# Patient Record
Sex: Female | Born: 1980 | Race: White | Marital: Married | Smoking: Never smoker
Health system: Northeastern US, Academic
[De-identification: ages and names within clinical notes are randomized; demographics above are authoritative.]

## PROBLEM LIST (undated history)

## (undated) DIAGNOSIS — O1490 Unspecified pre-eclampsia, unspecified trimester: Secondary | ICD-10-CM

## (undated) DIAGNOSIS — T7840XA Allergy, unspecified, initial encounter: Secondary | ICD-10-CM

## (undated) DIAGNOSIS — E049 Nontoxic goiter, unspecified: Secondary | ICD-10-CM

## (undated) HISTORY — PX: NO PAST SURGERIES: SHX2092

## (undated) HISTORY — DX: Allergy, unspecified, initial encounter: T78.40XA

## (undated) HISTORY — DX: Unspecified pre-eclampsia, unspecified trimester: O14.90

---

## 2014-02-11 LAB — HM MAMMOGRAPHY

## 2015-03-16 LAB — HM PAP SMEAR: HM PAP: NEGATIVE

## 2016-09-13 ENCOUNTER — Other Ambulatory Visit: Payer: Self-pay | Admitting: Obstetrics & Gynecology

## 2016-09-13 DIAGNOSIS — Z3045 Encounter for surveillance of transdermal patch hormonal contraceptive device: Secondary | ICD-10-CM

## 2016-09-13 NOTE — Telephone Encounter (Signed)
Pt calling - she's trying to refill her xulane which she's been on for years and was told there was a manufacturing thing and pharm doesn't think it will be getting any more. Pt doesn't know what to do nor what will take it's place.  Please adv.  [phone]

## 2016-09-13 NOTE — Telephone Encounter (Signed)
rx called into [facility] in [place]. Pharmacy states its not a problem, they have the [facility]

## 2017-04-26 ENCOUNTER — Other Ambulatory Visit: Payer: Self-pay

## 2017-04-26 MED ORDER — NORELGESTROMIN-ETH ESTRADIOL 150-35 MCG/24HR TD PTWK: 1.0000 | MEDICATED_PATCH | TRANSDERMAL | 0 refills | Status: DC

## 2017-04-26 NOTE — Telephone Encounter (Signed)
1 refill sent to pharm to last to pt appt end of this month

## 2017-05-10 ENCOUNTER — Encounter: Payer: Self-pay | Admitting: Obstetrics & Gynecology

## 2017-05-10 ENCOUNTER — Ambulatory Visit (INDEPENDENT_AMBULATORY_CARE_PROVIDER_SITE_OTHER): Payer: 59 | Admitting: Obstetrics & Gynecology

## 2017-05-10 VITALS — BP 130/90 | HR 76 | Ht 65.0 in | Wt 127.0 lb

## 2017-05-10 DIAGNOSIS — Z13220 Encounter for screening for lipoid disorders: Secondary | ICD-10-CM

## 2017-05-10 DIAGNOSIS — Z1321 Encounter for screening for nutritional disorder: Secondary | ICD-10-CM | POA: Diagnosis not present

## 2017-05-10 DIAGNOSIS — Z131 Encounter for screening for diabetes mellitus: Secondary | ICD-10-CM

## 2017-05-10 DIAGNOSIS — Z0000 Encounter for general adult medical examination without abnormal findings: Secondary | ICD-10-CM | POA: Insufficient documentation

## 2017-05-10 MED ORDER — NORELGESTROMIN-ETH ESTRADIOL 150-35 MCG/24HR TD PTWK: 1.0000 | MEDICATED_PATCH | TRANSDERMAL | 12 refills | Status: DC

## 2017-05-10 NOTE — Progress Notes (Signed)
HPI:      [name] is a 36 y.o. G1P1001 who LMP was Patient's last menstrual period was 04/21/2017., she presents today for her annual examination. The patient has no complaints today. The patient is sexually active. Her last pap: approximate date 2016 and was normal. The patient does perform self breast exams.  There is no notable family history of breast or ovarian cancer in her family.  The patient has regular exercise: yes.  The patient denies current symptoms of depression.    GYN History: Contraception: Ortho-Evra patches weekly  PMHx: History reviewed. No pertinent past medical history. History reviewed. No pertinent surgical history. History reviewed. No pertinent family history. Social History   Tobacco Use  . Smoking status: Never Smoker  . Smokeless tobacco: Never Used  Substance Use Topics  . Alcohol use: No    Frequency: Never  . Drug use: No    Current Outpatient Medications:  .  norelgestromin-ethinyl estradiol Burr Medico(XULANE) 150-35 MCG/24HR transdermal patch, Place 1 patch onto the skin once a week., Disp: 3 patch, Rfl: 12 Allergies: Penicillins  Review of Systems  Constitutional: Negative for chills, fever and malaise/fatigue.  HENT: Negative for congestion, sinus pain and sore throat.   Eyes: Negative for blurred vision and pain.  Respiratory: Negative for cough and wheezing.   Cardiovascular: Negative for chest pain and leg swelling.  Gastrointestinal: Negative for abdominal pain, constipation, diarrhea, heartburn, nausea and vomiting.  Genitourinary: Negative for dysuria, frequency, hematuria and urgency.  Musculoskeletal: Negative for back pain, joint pain, myalgias and neck pain.  Skin: Negative for itching and rash.  Neurological: Negative for dizziness, tremors and weakness.  Endo/Heme/Allergies: Does not bruise/bleed easily.  Psychiatric/Behavioral: Negative for depression. The patient is not nervous/anxious and does not have insomnia.      Objective: BP 130/90   Pulse 76   Ht 5\' 5"  (1.651 m)   Wt 127 lb (57.6 kg)   LMP 04/21/2017   BMI 21.13 kg/m   Filed Weights   05/10/17 1538  Weight: 127 lb (57.6 kg)   Body mass index is 21.13 kg/m. Physical Exam  Constitutional: She is oriented to person, place, and time. She appears well-developed and well-nourished. No distress.  Genitourinary: Rectum normal, vagina normal and uterus normal. Pelvic exam was performed with patient supine. There is no rash or lesion on the right labia. There is no rash or lesion on the left labia. Vagina exhibits no lesion. No bleeding in the vagina. Right adnexum does not display mass and does not display tenderness. Left adnexum does not display mass and does not display tenderness. Cervix does not exhibit motion tenderness, lesion, friability or polyp.   Uterus is mobile and midaxial. Uterus is not enlarged or exhibiting a mass.  HENT:  Head: Normocephalic and atraumatic. Head is without laceration.  Right Ear: Hearing normal.  Left Ear: Hearing normal.  Nose: No epistaxis.  No foreign bodies.  Mouth/Throat: Uvula is midline, oropharynx is clear and moist and mucous membranes are normal.  Eyes: Pupils are equal, round, and reactive to light.  Neck: Normal range of motion. Neck supple. No thyromegaly present.  Cardiovascular: Normal rate and regular rhythm. Exam reveals no gallop and no friction rub.  No murmur heard. Pulmonary/Chest: Effort normal and breath sounds normal. No respiratory distress. She has no wheezes. Right breast exhibits no mass, no skin change and no tenderness. Left breast exhibits no mass, no skin change and no tenderness.  Abdominal: Soft. Bowel sounds are normal.  She exhibits no distension. There is no tenderness. There is no rebound.  Musculoskeletal: Normal range of motion.  Neurological: She is alert and oriented to person, place, and time. No cranial nerve deficit.  Skin: Skin is warm and dry.  Psychiatric: She  has a normal mood and affect. Judgment normal.  Vitals reviewed.   Assessment:  ANNUAL EXAM 1. Annual physical exam   2. Screening for cholesterol level   3. Encounter for vitamin deficiency screening   4. Screening for diabetes mellitus      Screening Plan:            1.  Cervical Screening-  Pap smear schedule reviewed with patient  2. Breast screening- Exam annually and mammogram>40 planned   3. Colonoscopy every 10 years, Hemoccult testing - after age 38  4. Labs To return fasting at a later date  5. Counseling for contraception: Lucienne MinksOrtho-Evra    F/U  Return in about 1 year (around 05/10/2018) for Annual.  Annamarie MajorPaul Harris, MD, Merlinda FrederickFACOG [facility], [facility] 05/10/2017  4:11 PM

## 2017-05-10 NOTE — Patient Instructions (Addendum)
PAP every three years (2019) Labs due

## 2017-05-31 ENCOUNTER — Other Ambulatory Visit: Payer: 59

## 2017-06-12 ENCOUNTER — Other Ambulatory Visit: Payer: 59

## 2017-06-12 DIAGNOSIS — Z1321 Encounter for screening for nutritional disorder: Secondary | ICD-10-CM

## 2017-06-12 DIAGNOSIS — Z13220 Encounter for screening for lipoid disorders: Secondary | ICD-10-CM

## 2017-06-12 DIAGNOSIS — Z0000 Encounter for general adult medical examination without abnormal findings: Secondary | ICD-10-CM

## 2017-06-12 DIAGNOSIS — Z131 Encounter for screening for diabetes mellitus: Secondary | ICD-10-CM

## 2017-06-13 LAB — LIPID PANEL
Chol/HDL Ratio: 2.7 ratio (ref 0.0–4.4)
Cholesterol, Total: 226 mg/dL — ABNORMAL HIGH (ref 100–199)
HDL: 85 mg/dL (ref >39)
LDL Calculated: 121 mg/dL — ABNORMAL HIGH (ref 0–99)
TRIGLYCERIDES: 101 mg/dL (ref 0–149)
VLDL Cholesterol Cal: 20 mg/dL (ref 5–40)

## 2017-06-13 LAB — GLUCOSE, FASTING: Glucose, Plasma: 72 mg/dL (ref 65–99)

## 2017-06-13 LAB — VITAMIN D 25 HYDROXY (VIT D DEFICIENCY, FRACTURES): VIT D 25 HYDROXY: 23.6 ng/mL — AB (ref 30.0–100.0)

## 2017-06-13 LAB — TSH: TSH: 0.723 u[IU]/mL (ref 0.450–4.500)

## 2018-05-19 ENCOUNTER — Other Ambulatory Visit: Payer: Self-pay

## 2018-05-19 MED ORDER — NORELGESTROMIN-ETH ESTRADIOL 150-35 MCG/24HR TD PTWK: 1.0000 | MEDICATED_PATCH | TRANSDERMAL | 2 refills | Status: DC

## 2018-05-19 NOTE — Telephone Encounter (Signed)
Pt aware by vm refill eRx'd. 

## 2018-06-26 ENCOUNTER — Ambulatory Visit (INDEPENDENT_AMBULATORY_CARE_PROVIDER_SITE_OTHER): Payer: 59 | Admitting: Obstetrics & Gynecology

## 2018-06-26 ENCOUNTER — Encounter: Payer: Self-pay | Admitting: Obstetrics & Gynecology

## 2018-06-26 ENCOUNTER — Other Ambulatory Visit (HOSPITAL_COMMUNITY)
Admission: RE | Admit: 2018-06-26 | Discharge: 2018-06-26 | Disposition: A | Discharge status: Home / Self Care | Payer: Managed Care, Other (non HMO) | Source: Ambulatory Visit | Attending: Obstetrics & Gynecology | Admitting: Obstetrics & Gynecology

## 2018-06-26 VITALS — BP 120/80 | Ht 65.0 in | Wt 125.0 lb

## 2018-06-26 DIAGNOSIS — Z01419 Encounter for gynecological examination (general) (routine) without abnormal findings: Secondary | ICD-10-CM

## 2018-06-26 DIAGNOSIS — Z124 Encounter for screening for malignant neoplasm of cervix: Secondary | ICD-10-CM | POA: Insufficient documentation

## 2018-06-26 DIAGNOSIS — E559 Vitamin D deficiency, unspecified: Secondary | ICD-10-CM | POA: Diagnosis not present

## 2018-06-26 DIAGNOSIS — Z1239 Encounter for other screening for malignant neoplasm of breast: Secondary | ICD-10-CM | POA: Diagnosis not present

## 2018-06-26 DIAGNOSIS — E7800 Pure hypercholesterolemia, unspecified: Secondary | ICD-10-CM

## 2018-06-26 MED ORDER — NORELGESTROMIN-ETH ESTRADIOL 150-35 MCG/24HR TD PTWK: 1.0000 | MEDICATED_PATCH | TRANSDERMAL | 3 refills | Status: DC

## 2018-06-26 NOTE — Progress Notes (Signed)
HPI:      Kerri Graves is a 38 y.o. G1P1001 who LMP was Patient's last menstrual period was 06/16/2018., she presents today for her annual examination. The patient has no complaints today. The patient is sexually active. Her last pap: approximate date 2017 and was normal and last mammogram: approximate date 2015 and was normal. The patient does perform self breast exams.  There is no notable family history of breast or ovarian cancer in her family, although she is adopted and knows little of her FH.  The patient has regular exercise: yes.  The patient denies current symptoms of depression.    GYN History: Contraception: Ortho-Evra patches weekly  PMHx: History reviewed. No pertinent past medical history. History reviewed. No pertinent surgical history. History reviewed. No pertinent family history. Social History   Tobacco Use  . Smoking status: Never Smoker  . Smokeless tobacco: Never Used  Substance Use Topics  . Alcohol use: No    Frequency: Never  . Drug use: No    Current Outpatient Medications:  .  norelgestromin-ethinyl estradiol Burr Medico) 150-35 MCG/24HR transdermal patch, Place 1 patch onto the skin once a week. 3 weeks on, one week off, Disp: 9 patch, Rfl: 3 Allergies: Penicillins  Review of Systems  Constitutional: Negative for chills, fever and malaise/fatigue.  HENT: Negative for congestion, sinus pain and sore throat.   Eyes: Negative for blurred vision and pain.  Respiratory: Negative for cough and wheezing.   Cardiovascular: Negative for chest pain and leg swelling.  Gastrointestinal: Negative for abdominal pain, constipation, diarrhea, heartburn, nausea and vomiting.  Genitourinary: Negative for dysuria, frequency, hematuria and urgency.  Musculoskeletal: Negative for back pain, joint pain, myalgias and neck pain.  Skin: Negative for itching and rash.  Neurological: Negative for dizziness, tremors and weakness.  Endo/Heme/Allergies: Does not  bruise/bleed easily.  Psychiatric/Behavioral: Negative for depression. The patient is not nervous/anxious and does not have insomnia.     Objective: BP 120/80   Ht 5\' 5"  (1.651 m)   Wt 125 lb (56.7 kg)   LMP 06/16/2018   BMI 20.80 kg/m   Filed Weights   06/26/18 1530  Weight: 125 lb (56.7 kg)   Body mass index is 20.8 kg/m. Physical Exam Constitutional:      General: She is not in acute distress.    Appearance: She is well-developed.  Genitourinary:     Pelvic exam was performed with patient supine.     Vagina, uterus and rectum normal.     No lesions in the vagina.     No vaginal bleeding.     No cervical motion tenderness, friability, lesion or polyp.     Uterus is mobile.     Uterus is not enlarged.     No uterine mass detected.    Uterus is midaxial.     No right or left adnexal mass present.     Right adnexa not tender.     Left adnexa not tender.  HENT:     Head: Normocephalic and atraumatic. No laceration.     Right Ear: Hearing normal.     Left Ear: Hearing normal.     Mouth/Throat:     Pharynx: Uvula midline.  Eyes:     Pupils: Pupils are equal, round, and reactive to light.  Neck:     Musculoskeletal: Normal range of motion and neck supple.     Thyroid: No thyromegaly.  Cardiovascular:     Rate and Rhythm: Normal rate and regular  rhythm.     Heart sounds: No murmur. No friction rub. No gallop.   Pulmonary:     Effort: Pulmonary effort is normal. No respiratory distress.     Breath sounds: Normal breath sounds. No wheezing.  Chest:     Breasts:        Right: No mass, skin change or tenderness.        Left: No mass, skin change or tenderness.  Abdominal:     General: Bowel sounds are normal. There is no distension.     Palpations: Abdomen is soft.     Tenderness: There is no abdominal tenderness. There is no rebound.  Musculoskeletal: Normal range of motion.  Neurological:     Mental Status: She is alert and oriented to person, place, and time.      Cranial Nerves: No cranial nerve deficit.  Skin:    General: Skin is warm and dry.  Psychiatric:        Judgment: Judgment normal.  Vitals signs reviewed.   Assessment:  ANNUAL EXAM 1. Women's annual routine gynecological examination   2. Screening for cervical cancer   3. Screening for breast cancer   4. Vitamin D deficiency   5. Hypercholesteremia    Screening Plan:            1.  Cervical Screening-  Pap smear done today  2. Breast screening- Exam annually and mammogram>40 planned   3. Colonoscopy every 10 years, Hemoccult testing - after age 52  4. Labs To return fasting at a later date  5. Counseling for contraception: Ortho-Evra   6. Vitamin D deficiency - VITAMIN D 25 Hydroxy (Vit-D Deficiency, Fractures); Future  7. Hypercholesteremia - Lipid panel; Future      F/U  Return in about 1 year (around 06/27/2019) for Annual.  [name], MD, Merlinda Frederick Ob/Gyn, [facility] 06/26/2018  4:06 PM

## 2018-06-26 NOTE — Patient Instructions (Addendum)
PAP every three years Mammogram every year    Call [phone] to schedule at [facility] for follow up summer

## 2018-07-01 LAB — CYTOLOGY - PAP
Diagnosis: NEGATIVE
HPV (WINDOPATH): NOT DETECTED

## 2019-06-19 ENCOUNTER — Other Ambulatory Visit: Payer: Self-pay | Admitting: Obstetrics & Gynecology

## 2019-06-22 ENCOUNTER — Other Ambulatory Visit: Payer: Self-pay

## 2019-06-22 ENCOUNTER — Telehealth: Payer: Self-pay | Admitting: Obstetrics & Gynecology

## 2019-06-22 MED ORDER — XULANE 150-35 MCG/24HR TD PTWK: MEDICATED_PATCH | TRANSDERMAL | 0 refills | Status: DC

## 2019-06-22 NOTE — Telephone Encounter (Signed)
Called and left voice mail for patient to call back to be schedule °

## 2019-06-22 NOTE — Telephone Encounter (Signed)
Done

## 2019-06-22 NOTE — Telephone Encounter (Signed)
pts ean is sched please refill rx

## 2019-06-22 NOTE — Telephone Encounter (Signed)
Sch annual.  Refill done

## 2019-07-24 ENCOUNTER — Other Ambulatory Visit: Payer: Self-pay

## 2019-07-24 ENCOUNTER — Encounter: Payer: Self-pay | Admitting: Obstetrics & Gynecology

## 2019-07-24 ENCOUNTER — Ambulatory Visit (INDEPENDENT_AMBULATORY_CARE_PROVIDER_SITE_OTHER): Payer: No Typology Code available for payment source | Admitting: Obstetrics & Gynecology

## 2019-07-24 VITALS — BP 120/80 | Ht 65.0 in | Wt 132.0 lb

## 2019-07-24 DIAGNOSIS — Z01419 Encounter for gynecological examination (general) (routine) without abnormal findings: Secondary | ICD-10-CM

## 2019-07-24 DIAGNOSIS — Z1231 Encounter for screening mammogram for malignant neoplasm of breast: Secondary | ICD-10-CM

## 2019-07-24 DIAGNOSIS — E559 Vitamin D deficiency, unspecified: Secondary | ICD-10-CM

## 2019-07-24 DIAGNOSIS — E7800 Pure hypercholesterolemia, unspecified: Secondary | ICD-10-CM | POA: Diagnosis not present

## 2019-07-24 MED ORDER — XULANE 150-35 MCG/24HR TD PTWK: MEDICATED_PATCH | TRANSDERMAL | 3 refills | Status: DC

## 2019-07-24 NOTE — Progress Notes (Signed)
HPI:      [name] is a 39 y.o. G1P1001 who LMP was Patient's last menstrual period was 07/13/2019., she presents today for her annual examination. The patient has no complaints today. The patient is sexually active. Her last pap: approximate date 2020 and was normal and last mammogram: approximate date years ago and was normal. The patient does perform self breast exams.  There is no notable family history of breast or ovarian cancer in her family.  The patient has regular exercise: yes.  The patient denies current symptoms of depression.    GYN History: Contraception: Ortho-Evra patches weekly  PMHx: History reviewed. No pertinent past medical history. History reviewed. No pertinent surgical history. History reviewed. No pertinent family history. Social History   Tobacco Use  . Smoking status: Never Smoker  . Smokeless tobacco: Never Used  Substance Use Topics  . Alcohol use: No  . Drug use: No    Current Outpatient Medications:  .  norelgestromin-ethinyl estradiol (XULANE) 150-35 MCG/24HR transdermal patch, PLACE 1 PATCH ONTO THE SKIN ONCE A WEEK. 3 WEEKS ON, ONE WEEK OFF, AS DIRECTED, Disp: 9 patch, Rfl: 0 Allergies: Penicillins  Review of Systems  Constitutional: Negative for chills, fever and malaise/fatigue.  HENT: Negative for congestion, sinus pain and sore throat.   Eyes: Negative for blurred vision and pain.  Respiratory: Negative for cough and wheezing.   Cardiovascular: Negative for chest pain and leg swelling.  Gastrointestinal: Negative for abdominal pain, constipation, diarrhea, heartburn, nausea and vomiting.  Genitourinary: Negative for dysuria, frequency, hematuria and urgency.  Musculoskeletal: Negative for back pain, joint pain, myalgias and neck pain.  Skin: Negative for itching and rash.  Neurological: Negative for dizziness, tremors and weakness.  Endo/Heme/Allergies: Does not bruise/bleed easily.  Psychiatric/Behavioral: Negative for  depression. The patient is not nervous/anxious and does not have insomnia.     Objective: BP 120/80   Ht 5\' 5"  (1.651 m)   Wt 132 lb (59.9 kg)   LMP 07/13/2019   BMI 21.97 kg/m   Filed Weights   07/24/19 1527  Weight: 132 lb (59.9 kg)   Body mass index is 21.97 kg/m. Physical Exam Constitutional:      General: She is not in acute distress.    Appearance: She is well-developed.  Genitourinary:     Pelvic exam was performed with patient supine.     Vagina, uterus and rectum normal.     No lesions in the vagina.     No vaginal bleeding.     No cervical motion tenderness, friability, lesion or polyp.     Uterus is mobile.     Uterus is not enlarged.     No uterine mass detected.    Uterus is midaxial.     No right or left adnexal mass present.     Right adnexa not tender.     Left adnexa not tender.  HENT:     Head: Normocephalic and atraumatic. No laceration.     Right Ear: Hearing normal.     Left Ear: Hearing normal.     Mouth/Throat:     Pharynx: Uvula midline.  Eyes:     Pupils: Pupils are equal, round, and reactive to light.  Neck:     Thyroid: No thyromegaly.  Cardiovascular:     Rate and Rhythm: Normal rate and regular rhythm.     Heart sounds: No murmur. No friction rub. No gallop.   Pulmonary:     Effort: Pulmonary effort  is normal. No respiratory distress.     Breath sounds: Normal breath sounds. No wheezing.  Chest:     Breasts:        Right: No mass, skin change or tenderness.        Left: No mass, skin change or tenderness.  Abdominal:     General: Bowel sounds are normal. There is no distension.     Palpations: Abdomen is soft.     Tenderness: There is no abdominal tenderness. There is no rebound.  Musculoskeletal:        General: Normal range of motion.     Cervical back: Normal range of motion and neck supple.  Neurological:     Mental Status: She is alert and oriented to person, place, and time.     Cranial Nerves: No cranial nerve deficit.    Skin:    General: Skin is warm and dry.  Psychiatric:        Judgment: Judgment normal.  Vitals reviewed.     Assessment:  ANNUAL EXAM 1. Women's annual routine gynecological examination   2. Encounter for screening mammogram for malignant neoplasm of breast   3. Vitamin D deficiency   4. Hypercholesteremia      Screening Plan:            1.  Cervical Screening every 3 years  2. Breast screening- Exam annually and mammogram>40 planned   3. Labs To return fasting at a later date  Prior low Vit D and high Cholesterol  5. Counseling for contraception: Ortho-Evra patches monthly     F/U  Return in about 1 year (around 07/23/2020) for Annual.  [name], MD, Merlinda Frederick Ob/Gyn, [facility] 07/24/2019  4:17 PM

## 2019-07-24 NOTE — Patient Instructions (Signed)
PAP every three years Mammogram every year    Call [phone] to schedule at [facility] soon (fasting)

## 2019-10-16 ENCOUNTER — Other Ambulatory Visit: Payer: Self-pay | Admitting: Obstetrics & Gynecology

## 2019-10-20 ENCOUNTER — Telehealth: Payer: Self-pay

## 2019-10-20 NOTE — Telephone Encounter (Signed)
Called pt to remind her to schedule her mammo , had to leave voice message

## 2019-10-20 NOTE — Telephone Encounter (Signed)
-----   Message from Nadara Mustard, MD sent at 10/16/2019  3:21 PM EDT ----- Regarding: MMG Received notice she has not received MMG yet as ordered at her Annual. Please check and encourage her to do this, and document conversation.

## 2019-11-18 ENCOUNTER — Other Ambulatory Visit: Payer: Self-pay

## 2019-11-18 ENCOUNTER — Ambulatory Visit
Admission: RE | Admit: 2019-11-18 | Discharge: 2019-11-18 | Disposition: A | Discharge status: Home / Self Care | Payer: No Typology Code available for payment source | Source: Ambulatory Visit | Attending: Obstetrics & Gynecology | Admitting: Obstetrics & Gynecology

## 2019-11-18 DIAGNOSIS — Z1231 Encounter for screening mammogram for malignant neoplasm of breast: Secondary | ICD-10-CM | POA: Insufficient documentation

## 2020-06-06 ENCOUNTER — Ambulatory Visit
Admission: AD | Admit: 2020-06-06 | Discharge: 2020-06-06 | Disposition: A | Discharge status: Other Acute Inpatient Hospital | Payer: PRIVATE HEALTH INSURANCE | Source: Ambulatory Visit | Attending: Emergency Medicine | Admitting: Emergency Medicine

## 2020-06-06 ENCOUNTER — Emergency Department
Admission: EM | Admit: 2020-06-06 | Discharge: 2020-06-06 | Disposition: A | Discharge status: Home / Self Care | Payer: PRIVATE HEALTH INSURANCE | Source: Ambulatory Visit | Attending: Emergency Medicine | Admitting: Emergency Medicine

## 2020-06-06 ENCOUNTER — Emergency Department: Payer: PRIVATE HEALTH INSURANCE

## 2020-06-06 DIAGNOSIS — Z3202 Encounter for pregnancy test, result negative: Secondary | ICD-10-CM | POA: Insufficient documentation

## 2020-06-06 DIAGNOSIS — E042 Nontoxic multinodular goiter: Secondary | ICD-10-CM

## 2020-06-06 DIAGNOSIS — R221 Localized swelling, mass and lump, neck: Secondary | ICD-10-CM | POA: Insufficient documentation

## 2020-06-06 DIAGNOSIS — E041 Nontoxic single thyroid nodule: Secondary | ICD-10-CM | POA: Insufficient documentation

## 2020-06-06 LAB — CBC AND DIFFERENTIAL
Baso # K/uL: 0 10*3/uL (ref 0.0–0.1)
Basophil %: 0.3 %
Eos # K/uL: 0.1 10*3/uL (ref 0.0–0.4)
Eosinophil %: 0.8 %
Hematocrit: 39 % (ref 34–45)
Hemoglobin: 12.8 g/dL (ref 11.2–15.7)
IMM Granulocytes #: 0 10*3/uL (ref 0.0–0.0)
IMM Granulocytes: 0.3 %
Lymph # K/uL: 1.9 10*3/uL (ref 1.2–3.7)
Lymphocyte %: 18.7 %
MCH: 31 pg (ref 26–32)
MCHC: 33 g/dL (ref 32–36)
MCV: 95 fL (ref 79–95)
Mono # K/uL: 0.6 10*3/uL (ref 0.2–0.9)
Monocyte %: 5.6 %
Neut # K/uL: 7.7 10*3/uL — ABNORMAL HIGH (ref 1.6–6.1)
Nucl RBC # K/uL: 0 10*3/uL (ref 0.0–0.0)
Nucl RBC %: 0 /100 WBC (ref 0.0–0.2)
Platelets: 268 10*3/uL (ref 160–370)
RBC: 4.1 MIL/uL (ref 3.9–5.2)
RDW: 12.3 % (ref 11.7–14.4)
Seg Neut %: 74.3 %
WBC: 10.3 10*3/uL — ABNORMAL HIGH (ref 4.0–10.0)

## 2020-06-06 LAB — T4, FREE: Free T4: 1.2 ng/dL (ref 0.9–1.7)

## 2020-06-06 LAB — COMPREHENSIVE METABOLIC PANEL
ALT: 9 U/L (ref 0–35)
AST: 16 U/L (ref 0–35)
Albumin: 4 g/dL (ref 3.5–5.2)
Alk Phos: 38 U/L (ref 35–105)
Anion Gap: 11 (ref 7–16)
Bilirubin,Total: 0.4 mg/dL (ref 0.0–1.2)
CO2: 20 mmol/L (ref 20–28)
Calcium: 8.6 mg/dL — ABNORMAL LOW (ref 8.8–10.2)
Chloride: 100 mmol/L (ref 96–108)
Creatinine: 0.87 mg/dL (ref 0.51–0.95)
GFR,Black: 95 *
GFR,Caucasian: 82 *
Glucose: 90 mg/dL (ref 60–99)
Lab: 16 mg/dL (ref 6–20)
Potassium: 4.1 mmol/L (ref 3.4–4.7)
Sodium: 131 mmol/L — ABNORMAL LOW (ref 133–145)
Total Protein: 6.7 g/dL (ref 6.3–7.7)

## 2020-06-06 LAB — T3, FREE: T3,Free: 3.1 pg/mL (ref 2.0–4.4)

## 2020-06-06 LAB — TSH: TSH: 0.61 u[IU]/mL (ref 0.27–4.20)

## 2020-06-06 LAB — PREGNANCY TEST, SERUM: Preg,Serum: NEGATIVE

## 2020-06-06 LAB — HM HIV SCREENING OFFERED

## 2020-06-06 MED ORDER — IOHEXOL 350 MG/ML (OMNIPAQUE) IV SOLN 500ML BOTTLE *I*
1.0000 mL | Freq: Once | INTRAVENOUS | Status: AC
Start: 2020-06-06 — End: 2020-06-06
  Administered 2020-06-06: 100 mL via INTRAVENOUS

## 2020-06-06 NOTE — ED Triage Notes (Signed)
Patient here with lump on neck  Started yesterday and not painful    Patient is from out of town and would like evaluated

## 2020-06-06 NOTE — Discharge Instructions (Signed)
Go to [facility] emergency department for evaluation of neck mass

## 2020-06-06 NOTE — ED Provider Notes (Signed)
History     Chief Complaint   Patient presents with    Mass     neck     39 yr old F presents with concern regarding fullness/mass to left anterior neck- pt states she noted this incidentally while itching her neck yesterday. She states there was some concern about her thyroid function during her last pregnancy approx 16 years ago (as well as preeclampsia) but no issues since. She denies any recent illness. Denies any pain.        History provided by:  Patient      Medical/Surgical/Family History     Past Medical History:   Diagnosis Date    Preeclampsia         There is no problem list on file for this patient.           Past Surgical History:   Procedure Laterality Date    TONSILLECTOMY       No family history on file.       Social History     Tobacco Use    Smoking status: Never Smoker    Smokeless tobacco: Never Used   Substance Use Topics    Alcohol use: Yes    Drug use: Never     Living Situation     Questions Responses    Patient lives with Spouse    Homeless     Caregiver for other family member     External Services     Employment Employed    Domestic Violence Risk No                Review of Systems   Review of Systems   Constitutional: Negative for activity change, appetite change, diaphoresis, fatigue, fever and unexpected weight change.   HENT: Negative for congestion and rhinorrhea.    Eyes: Negative for visual disturbance.   Respiratory: Negative for chest tightness and shortness of breath.    Cardiovascular: Negative for chest pain.   Gastrointestinal: Negative for abdominal pain, nausea and vomiting.   Genitourinary: Negative.    Musculoskeletal: Negative for back pain and neck pain.   Skin: Negative for rash and wound.   Neurological: Negative for dizziness, speech difficulty, weakness, numbness and headaches.   Psychiatric/Behavioral: Negative for confusion. The patient is not hyperactive.    All other systems reviewed and are negative.      Physical Exam     Triage Vitals      First  Recorded BP: 137/89, Resp: 18 Oxygen Therapy SpO2: 98 %, Oximetry Source: Rt Hand, O2 Device: None (Room air), Heart Rate: 97, (06/06/20 0901) Heart Rate (via Pulse Ox): 97, (06/06/20 0901).      Physical Exam  Vitals and nursing note reviewed.   Constitutional:       Appearance: Normal appearance.   HENT:      Head: Normocephalic and atraumatic.      Right Ear: External ear normal.      Left Ear: External ear normal.      Nose: Nose normal.      Mouth/Throat:      Mouth: Mucous membranes are moist.      Pharynx: No oropharyngeal exudate or posterior oropharyngeal erythema.   Eyes:      Extraocular Movements: Extraocular movements intact.      Pupils: Pupils are equal, round, and reactive to light.   Neck:      Thyroid: Thyroid mass present.      Trachea: Trachea and phonation normal.  Comments: Non mobile painless 1+ cm mass to left mid anterior neck in region of left lobe of thyroid  Cardiovascular:      Rate and Rhythm: Normal rate and regular rhythm.   Pulmonary:      Effort: Pulmonary effort is normal.      Breath sounds: Normal breath sounds.   Abdominal:      General: Abdomen is flat.      Tenderness: There is no abdominal tenderness. There is no guarding or rebound.   Musculoskeletal:         General: Normal range of motion.      Cervical back: Full passive range of motion without pain, normal range of motion and neck supple. No pain with movement.   Lymphadenopathy:      Cervical: No cervical adenopathy.      Right cervical: No superficial, deep or posterior cervical adenopathy.     Left cervical: No superficial, deep or posterior cervical adenopathy.   Skin:     General: Skin is warm.   Neurological:      General: No focal deficit present.      Mental Status: She is alert and oriented to person, place, and time.   Psychiatric:         Mood and Affect: Mood normal.         Medical Decision Making   Patient seen by me on:  06/06/2020    Assessment:  39 yr old F presents with painless incidentally  discovered mass to left anterior neck    Differential diagnosis:  Thyroid nodule, isolated lymphadenopathy     Plan:  Labs, CT soft tissue neck    Independent review of: Existing labs, CT scans, chart/prior records    ED Course and Disposition:  Labs Reviewed  CBC AND DIFFERENTIAL - Abnormal; Notable for the following components:     WBC                           10.3 (*)               Neut # K/uL                   7.7 (*)             All other components within normal limits  COMPREHENSIVE METABOLIC PANEL - Abnormal; Notable for the following components:     Sodium                        131 (*)                Calcium                       8.6 (*)             All other components within normal limits  TSH  T4, FREE  PREGNANCY TEST, SERUM  T3, FREE    CT neck soft tissue with IV contrast   Final Result        The marked area of clinical concern corresponds to a low-attenuation nodule within the LEFT thyroid lobe measuring 2 cm. Additional subcentimeter low attenuation bilateral thyroid nodules are noted. If clinically warranted, consider dedicated thyroid     ultrasound for further evaluation, on a nonemergent basis.            END OF IMPRESSION  [facility] submits this DICOM format image data and final report to the [facility], an independent secure electronic health information exchange, on a reciprocally searchable basis (with patient authorization) for a minimum of 12 months after exam     date.       Results discussed- nothing further needed from an emergent standpoint- pt will follow up back in [place], recommend eval with ENT re: need for any further evaluation. Return here as needed              Fabio Asa, DO          FentonAntionette Poles, DO  06/06/20 1351

## 2020-06-06 NOTE — Discharge Instructions (Signed)
When you return home to [place] make arrangements through your PMD for ENT referral so you can be evaluated further for any additional workup that may be necessary

## 2020-06-06 NOTE — ED Notes (Signed)
Patient verbalizes understanding of discharge instructions. All questions/concerns addressed. IV removed, appropriate clothing in place, VSS, AOx4. Patient ambulated out on own, stable while ambulatory. Has an appropriate ride home. Given access to MyChart so patient can review admission results and future appointments.

## 2020-06-06 NOTE — ED Notes (Addendum)
Patient coming in after noticing a small lump on the left side of her neck. Patient states she is from out of town and was at [employer], when she went to itch her neck, and noticed the small bump. Patient denies any pain at the site and is tolerating secretions. Airway intact at this time.

## 2020-06-06 NOTE — ED Triage Notes (Signed)
Pt states she felt a lump/mass in the left side of neck yesterday. Pt is from out of town and didn't want to wait till Friday to get checked out. Pt denies pain in her neck.            Prehospital medications given: No

## 2020-06-06 NOTE — UC Provider Note (Signed)
History     Chief Complaint   Patient presents with    Neck Pain     Pt comes to uc for a swelling to her neck - left anterior neck - noted yesterday- no injury - not painful - no problem with swallowing or breathing          Medical/Surgical/Family History     Past Medical History:   Diagnosis Date    Preeclampsia         There is no problem list on file for this patient.           Past Surgical History:   Procedure Laterality Date    TONSILLECTOMY       No family history on file.       Social History     Tobacco Use    Smoking status: Never Smoker    Smokeless tobacco: Never Used   Substance Use Topics    Alcohol use: Yes    Drug use: Never     Living Situation     Questions Responses    Patient lives with Spouse    Homeless     Caregiver for other family member     External Services     Employment Employed    Domestic Violence Risk No                Review of Systems   Review of Systems   Constitutional: Negative for chills and fever.   HENT: Negative for trouble swallowing.    Respiratory: Negative for cough and shortness of breath.    Musculoskeletal: Negative for neck pain.        Area of swelling thyroid area anterior left       Physical Exam   Triage Vitals  Triage Start: Start, (06/06/20 5400)   First Recorded BP: 128/84, Resp: 14, Temp: 35.9 C (96.7 F), Temp src: TEMPORAL Oxygen Therapy SpO2: 100 %, Oximetry Source: Lt Hand, O2 Device: None (Room air), Heart Rate: 88, (06/06/20 0819)  .  First Pain Reported  0-10 Scale: 0, (06/06/20 8676)       Physical Exam  Vitals and nursing note reviewed.   Constitutional:       General: She is not in acute distress.     Appearance: She is not ill-appearing.   Skin:     General: Skin is warm and dry.      Findings: No erythema or rash.      Comments: Approximately 2 cm area of swelling left side of neck anteriorly - thyroid area - no signs of infection -- soft feel to swelling -    Neurological:      Mental Status: She is alert.          Medical Decision  Making        Medical Decision Making  Assessment:    Pt comes to uc for swelling to left side of neck anteriorly - thyroid area    Differential diagnosis:    Neck soft tissue swelling - thyroid nodule -     Plan and Results:    Referred pt to [facility] emergency for possible imaging        Diagnosis and Disposition:    Neck mass - referred to [facility] emergency - private car - dept notified    Return precautions discussed and provided on AVS.    Discharge Medications  Current Discharge Medication List      Follow-up  @AVSFOLLOWUP @  Patient Instructions   .  Go to [facility] emergency department for evaluation of neck mass      Final Diagnosis  Final diagnoses:   [R22.1] Neck mass (Primary)         Perley Jain, MD              Shelanda Duvall, Jewel Baize, MD  06/06/20 1243

## 2020-06-20 ENCOUNTER — Telehealth: Payer: Self-pay

## 2020-06-20 NOTE — Telephone Encounter (Signed)
Patient was seen in ED in [place] over Christmas and had a test done for a nodule on her Thyroid. That's what the CT scan that he did showed. They advised her she would need a referral for ENT. She is requesting [facility] send referral for ENT. [phone]

## 2020-06-21 ENCOUNTER — Other Ambulatory Visit: Payer: Self-pay | Admitting: Obstetrics & Gynecology

## 2020-06-21 DIAGNOSIS — E041 Nontoxic single thyroid nodule: Secondary | ICD-10-CM

## 2020-06-23 ENCOUNTER — Other Ambulatory Visit: Payer: Self-pay | Admitting: Obstetrics & Gynecology

## 2020-06-28 ENCOUNTER — Telehealth: Payer: Self-pay

## 2020-06-28 MED ORDER — XULANE 150-35 MCG/24HR TD PTWK: MEDICATED_PATCH | TRANSDERMAL | 0 refills | Status: DC

## 2020-06-28 NOTE — Telephone Encounter (Signed)
Patient needs refill of Xulane to get to scheduled apt 07/28/2020. Cb#[phone]

## 2020-07-11 ENCOUNTER — Other Ambulatory Visit (HOSPITAL_COMMUNITY): Payer: Self-pay | Admitting: Otolaryngology

## 2020-07-11 ENCOUNTER — Other Ambulatory Visit: Payer: Self-pay | Admitting: Otolaryngology

## 2020-07-11 DIAGNOSIS — E041 Nontoxic single thyroid nodule: Secondary | ICD-10-CM

## 2020-07-19 ENCOUNTER — Ambulatory Visit
Admission: RE | Admit: 2020-07-19 | Discharge: 2020-07-19 | Disposition: A | Discharge status: Home / Self Care | Payer: BC Managed Care – PPO | Source: Ambulatory Visit | Attending: Otolaryngology | Admitting: Otolaryngology

## 2020-07-19 ENCOUNTER — Other Ambulatory Visit: Payer: Self-pay

## 2020-07-19 DIAGNOSIS — E041 Nontoxic single thyroid nodule: Secondary | ICD-10-CM | POA: Diagnosis present

## 2020-07-28 ENCOUNTER — Ambulatory Visit (INDEPENDENT_AMBULATORY_CARE_PROVIDER_SITE_OTHER): Payer: BC Managed Care – PPO | Admitting: Obstetrics & Gynecology

## 2020-07-28 ENCOUNTER — Encounter: Payer: Self-pay | Admitting: Obstetrics & Gynecology

## 2020-07-28 ENCOUNTER — Other Ambulatory Visit: Payer: Self-pay

## 2020-07-28 VITALS — BP 120/80 | Ht 65.0 in | Wt 137.0 lb

## 2020-07-28 DIAGNOSIS — Z131 Encounter for screening for diabetes mellitus: Secondary | ICD-10-CM

## 2020-07-28 DIAGNOSIS — Z1231 Encounter for screening mammogram for malignant neoplasm of breast: Secondary | ICD-10-CM | POA: Diagnosis not present

## 2020-07-28 DIAGNOSIS — Z01419 Encounter for gynecological examination (general) (routine) without abnormal findings: Secondary | ICD-10-CM | POA: Diagnosis not present

## 2020-07-28 DIAGNOSIS — Z1321 Encounter for screening for nutritional disorder: Secondary | ICD-10-CM

## 2020-07-28 DIAGNOSIS — Z13220 Encounter for screening for lipoid disorders: Secondary | ICD-10-CM | POA: Diagnosis not present

## 2020-07-28 MED ORDER — XULANE 150-35 MCG/24HR TD PTWK: MEDICATED_PATCH | TRANSDERMAL | 4 refills | Status: DC

## 2020-07-28 NOTE — Progress Notes (Signed)
HPI:      [name] is a 40 y.o. G1P1001 who LMP was Patient's last menstrual period was 07/14/2020., she presents today for her annual examination. The patient has no complaints today. The patient is sexually active. Her last pap: approximate date 2020 and was normal and last mammogram: approximate date 2021 and was normal. The patient does perform self breast exams.  There is no notable family history of breast or ovarian cancer in her family.  The patient has regular exercise: yes.  The patient denies current symptoms of depression.    GYN History: Contraception: Ortho-Evra patches weekly  PMHx: History reviewed. No pertinent past medical history. History reviewed. No pertinent surgical history. Family History  Problem Relation Age of Onset  . Breast cancer Neg Hx    Social History   Tobacco Use  . Smoking status: Never Smoker  . Smokeless tobacco: Never Used  Vaping Use  . Vaping Use: Never used  Substance Use Topics  . Alcohol use: No  . Drug use: No    Current Outpatient Medications:  .  norelgestromin-ethinyl estradiol (XULANE) 150-35 MCG/24HR transdermal patch, PLACE 1 PATCH ONTO THE SKIN ONCE A WEEK. 3 WEEKS ON, ONE WEEK OFF, AS DIRECTED, Disp: 3 patch, Rfl: 0 Allergies: Penicillins  Review of Systems  Constitutional: Negative for chills, fever and malaise/fatigue.  HENT: Negative for congestion, sinus pain and sore throat.   Eyes: Negative for blurred vision and pain.  Respiratory: Negative for cough and wheezing.   Cardiovascular: Negative for chest pain and leg swelling.  Gastrointestinal: Negative for abdominal pain, constipation, diarrhea, heartburn, nausea and vomiting.  Genitourinary: Negative for dysuria, frequency, hematuria and urgency.  Musculoskeletal: Negative for back pain, joint pain, myalgias and neck pain.  Skin: Negative for itching and rash.  Neurological: Negative for dizziness, tremors and weakness.  Endo/Heme/Allergies: Does not  bruise/bleed easily.  Psychiatric/Behavioral: Negative for depression. The patient is not nervous/anxious and does not have insomnia.     Objective: BP 120/80   Ht 5\' 5"  (1.651 m)   Wt 137 lb (62.1 kg)   LMP 07/14/2020   BMI 22.80 kg/m   Filed Weights   07/28/20 1532  Weight: 137 lb (62.1 kg)   Body mass index is 22.8 kg/m. Physical Exam Constitutional:      General: She is not in acute distress.    Appearance: She is well-developed and well-nourished.  Genitourinary:     Vagina, uterus and rectum normal.     There is no rash or lesion on the right labia.     There is no rash or lesion on the left labia.    No lesions in the vagina.     No vaginal bleeding.      Right Adnexa: not tender and no mass present.    Left Adnexa: not tender and no mass present.    No cervical motion tenderness, friability, lesion or polyp.     Uterus is mobile.     Uterus is not enlarged.     No uterine mass detected.    Uterus is midaxial.     Pelvic exam was performed with patient supine.  Breasts:     Right: No mass, skin change or tenderness.     Left: No mass, skin change or tenderness.    HENT:     Head: Normocephalic and atraumatic. No laceration.     Right Ear: Hearing normal.     Left Ear: Hearing normal.  Nose: No epistaxis or foreign body.     Mouth/Throat:     Mouth: Oropharynx is clear and moist and mucous membranes are normal.     Pharynx: Uvula midline.  Eyes:     Pupils: Pupils are equal, round, and reactive to light.  Neck:     Thyroid: Thyroid mass present. No thyromegaly.      Comments: Mobile left sided thyroid nodule Cardiovascular:     Rate and Rhythm: Normal rate and regular rhythm.     Heart sounds: No murmur heard. No friction rub. No gallop.   Pulmonary:     Effort: Pulmonary effort is normal. No respiratory distress.     Breath sounds: Normal breath sounds. No wheezing.  Abdominal:     General: Bowel sounds are normal. There is no distension.      Palpations: Abdomen is soft.     Tenderness: There is no abdominal tenderness. There is no rebound.  Musculoskeletal:        General: Normal range of motion.     Cervical back: Normal range of motion and neck supple.  Neurological:     Mental Status: She is alert and oriented to person, place, and time.     Cranial Nerves: No cranial nerve deficit.  Skin:    General: Skin is warm and dry.  Psychiatric:        Mood and Affect: Mood and affect normal.        Judgment: Judgment normal.  Vitals reviewed.     Assessment:  ANNUAL EXAM 1. Women's annual routine gynecological examination   2. Encounter for screening mammogram for malignant neoplasm of breast   3. Screening for cholesterol level   4. Screening for diabetes mellitus   5. Encounter for vitamin deficiency screening      Screening Plan:            1.  Cervical Screening-  Pap smear not done today  2. Breast screening- Exam annually and mammogram>40 planned   3. Labs To return fasting at a later date  4. Counseling for contraception: Lucienne Minks     F/U  Return in about 1 year (around 07/28/2021) for Annual.  [name], MD, [facility], [facility] 07/28/2020  4:11 PM

## 2020-07-28 NOTE — Patient Instructions (Addendum)
PAP every three years Labs this summer, fasting Mammogram every year - due in July    Call [phone] to schedule at [facility]  Thank you for choosing [facility]. As part of our ongoing efforts to improve patient experience, we would appreciate your feedback. Please fill out the short survey that you will receive by mail or MyChart. Your opinion is important to [place]! - Dr. Tiburcio Pea

## 2020-08-01 ENCOUNTER — Other Ambulatory Visit: Payer: Self-pay | Admitting: Otolaryngology

## 2020-08-01 DIAGNOSIS — E041 Nontoxic single thyroid nodule: Secondary | ICD-10-CM

## 2020-08-21 ENCOUNTER — Other Ambulatory Visit: Payer: Self-pay | Admitting: Obstetrics & Gynecology

## 2020-10-20 ENCOUNTER — Other Ambulatory Visit: Payer: Self-pay | Admitting: Obstetrics & Gynecology

## 2020-11-23 ENCOUNTER — Other Ambulatory Visit: Payer: BC Managed Care – PPO

## 2020-11-24 ENCOUNTER — Other Ambulatory Visit: Payer: Self-pay

## 2020-11-24 ENCOUNTER — Other Ambulatory Visit: Payer: BC Managed Care – PPO

## 2020-11-24 DIAGNOSIS — Z1321 Encounter for screening for nutritional disorder: Secondary | ICD-10-CM

## 2020-11-24 DIAGNOSIS — Z13220 Encounter for screening for lipoid disorders: Secondary | ICD-10-CM

## 2020-11-24 DIAGNOSIS — Z131 Encounter for screening for diabetes mellitus: Secondary | ICD-10-CM

## 2020-11-25 LAB — VITAMIN D 25 HYDROXY (VIT D DEFICIENCY, FRACTURES): Vit D, 25-Hydroxy: 42.5 ng/mL (ref 30.0–100.0)

## 2020-11-25 LAB — LIPID PANEL
Chol/HDL Ratio: 2.6 ratio (ref 0.0–4.4)
Cholesterol, Total: 183 mg/dL (ref 100–199)
HDL: 70 mg/dL (ref >39)
LDL Chol Calc (NIH): 94 mg/dL (ref 0–99)
Triglycerides: 109 mg/dL (ref 0–149)
VLDL Cholesterol Cal: 19 mg/dL (ref 5–40)

## 2020-11-25 LAB — GLUCOSE, FASTING: Glucose, Plasma: 78 mg/dL (ref 65–99)

## 2021-02-08 ENCOUNTER — Ambulatory Visit
Admission: RE | Admit: 2021-02-08 | Discharge: 2021-02-08 | Disposition: A | Discharge status: Home / Self Care | Payer: BC Managed Care – PPO | Source: Ambulatory Visit | Attending: Obstetrics & Gynecology | Admitting: Obstetrics & Gynecology

## 2021-02-08 ENCOUNTER — Other Ambulatory Visit: Payer: Self-pay

## 2021-02-08 DIAGNOSIS — Z1231 Encounter for screening mammogram for malignant neoplasm of breast: Secondary | ICD-10-CM | POA: Diagnosis present

## 2021-02-14 ENCOUNTER — Other Ambulatory Visit: Payer: Self-pay | Admitting: Obstetrics & Gynecology

## 2021-02-14 DIAGNOSIS — R928 Other abnormal and inconclusive findings on diagnostic imaging of breast: Secondary | ICD-10-CM

## 2021-02-14 DIAGNOSIS — N6489 Other specified disorders of breast: Secondary | ICD-10-CM

## 2021-02-15 ENCOUNTER — Other Ambulatory Visit: Payer: Self-pay | Admitting: Obstetrics and Gynecology

## 2021-02-15 DIAGNOSIS — N6489 Other specified disorders of breast: Secondary | ICD-10-CM

## 2021-02-15 DIAGNOSIS — R928 Other abnormal and inconclusive findings on diagnostic imaging of breast: Secondary | ICD-10-CM

## 2021-02-24 ENCOUNTER — Other Ambulatory Visit: Payer: Self-pay

## 2021-02-24 ENCOUNTER — Ambulatory Visit
Admission: RE | Admit: 2021-02-24 | Discharge: 2021-02-24 | Disposition: A | Discharge status: Home / Self Care | Payer: BC Managed Care – PPO | Source: Ambulatory Visit | Attending: Obstetrics and Gynecology | Admitting: Obstetrics and Gynecology

## 2021-02-24 DIAGNOSIS — N6489 Other specified disorders of breast: Secondary | ICD-10-CM | POA: Diagnosis present

## 2021-02-24 DIAGNOSIS — R928 Other abnormal and inconclusive findings on diagnostic imaging of breast: Secondary | ICD-10-CM

## 2021-07-19 ENCOUNTER — Ambulatory Visit
Admission: RE | Admit: 2021-07-19 | Discharge: 2021-07-19 | Disposition: A | Discharge status: Home / Self Care | Payer: BC Managed Care – PPO | Source: Ambulatory Visit | Attending: Otolaryngology | Admitting: Otolaryngology

## 2021-07-19 ENCOUNTER — Other Ambulatory Visit: Payer: Self-pay

## 2021-07-19 DIAGNOSIS — E041 Nontoxic single thyroid nodule: Secondary | ICD-10-CM

## 2021-08-25 ENCOUNTER — Other Ambulatory Visit: Payer: Self-pay

## 2021-08-25 ENCOUNTER — Ambulatory Visit
Admission: RE | Admit: 2021-08-25 | Discharge: 2021-08-25 | Disposition: A | Discharge status: Home / Self Care | Payer: BC Managed Care – PPO | Source: Ambulatory Visit | Attending: Obstetrics and Gynecology | Admitting: Obstetrics and Gynecology

## 2021-08-25 DIAGNOSIS — N6489 Other specified disorders of breast: Secondary | ICD-10-CM | POA: Diagnosis present

## 2021-08-25 DIAGNOSIS — R928 Other abnormal and inconclusive findings on diagnostic imaging of breast: Secondary | ICD-10-CM | POA: Insufficient documentation

## 2021-08-27 ENCOUNTER — Encounter: Payer: Self-pay | Admitting: Obstetrics and Gynecology

## 2021-10-18 ENCOUNTER — Other Ambulatory Visit: Payer: Self-pay

## 2021-10-18 MED ORDER — XULANE 150-35 MCG/24HR TD PTWK: MEDICATED_PATCH | TRANSDERMAL | 0 refills | Status: DC

## 2021-10-18 NOTE — Telephone Encounter (Signed)
Pt needs RF on BC. Annual has been scheduled. RF sent, pt aware. ?

## 2021-11-01 ENCOUNTER — Ambulatory Visit
Admission: RE | Admit: 2021-11-01 | Discharge: 2021-11-01 | Disposition: A | Discharge status: Home / Self Care | Payer: BC Managed Care – PPO | Source: Ambulatory Visit | Attending: Emergency Medicine | Admitting: Emergency Medicine

## 2021-11-01 VITALS — BP 123/89 | HR 87 | Temp 98.2°F | Resp 18

## 2021-11-01 DIAGNOSIS — H00014 Hordeolum externum left upper eyelid: Secondary | ICD-10-CM | POA: Diagnosis not present

## 2021-11-01 HISTORY — DX: Nontoxic goiter, unspecified: E04.9

## 2021-11-01 MED ORDER — ERYTHROMYCIN 5 MG/GM OP OINT: TOPICAL_OINTMENT | OPHTHALMIC | 0 refills | Status: DC

## 2021-11-01 NOTE — ED Triage Notes (Signed)
Patient c/o LFT upper eye lid swelling x 1 day.   Patient denies visual changes.   Patient endorses redness upon waking this morning that has improved.   Patient states " I was outside yesterday and a bug did fly near my eye".   Patient endorses eye drainage.   Patient denies using any medications.

## 2021-11-01 NOTE — ED Provider Notes (Signed)
HPI  SUBJECTIVE:  Kerri Graves is a 41 y.o. female who presents with left upper eyelid tenderness, erythema, swelling starting last night.  No conjunctival injection, eye drainage, visual changes, photophobia, allergy symptoms.  She denies rubbing her eyes.  No trauma or foreign body sensation.  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  No aggravating or alleviating factors.  She has not tried anything for this.  Past medical history of thyroid nodule.   Past Medical History:  Diagnosis Date   Goiter     History reviewed. No pertinent surgical history.  Family History  Problem Relation Age of Onset   Breast cancer Neg Hx     Social History   Tobacco Use   Smoking status: Never   Smokeless tobacco: Never  Vaping Use   Vaping Use: Never used  Substance Use Topics   Alcohol use: No   Drug use: No    No current facility-administered medications for this encounter.  Current Outpatient Medications:    erythromycin ophthalmic ointment, 1 cm ribbon to affected eyelid qid x 10 days, Disp: 5 g, Rfl: 0   norelgestromin-ethinyl estradiol (XULANE) 150-35 MCG/24HR transdermal patch, UNWRAP AND APPLY 1 PATCH TO SKIN ONCE A WEEK FOR 3 WEEKS ON AND 1 WEEK OFF AS DIRECTED, Disp: 9 patch, Rfl: 0  Allergies  Allergen Reactions   Penicillins     " Since childhood, I don't know what happens".      ROS  As noted in HPI.   Physical Exam  BP 123/89   Pulse 87   Temp 98.2 F (36.8 C)   Resp 18   LMP 10/18/2021 (Approximate)   SpO2 99%   Constitutional: Well developed, well nourished, no acute distress Eyes:  EOMI, conjunctiva normal bilaterally, PERRLA.  No direct or consensual photophobia.  Positive erythema, very mild central tenderness upper left eyelid.  No foreign body seen on lid eversion.  Positive point on the lid margin where the stye will present.  No pointing.  No expressible purulent  drainage.         HENT: Normocephalic, atraumatic,mucus membranes moist Respiratory: Normal inspiratory effort Cardiovascular: Normal rate GI: nondistended skin: No rash, skin intact Musculoskeletal: no deformities Neurologic: Alert & oriented x 3, no focal neuro deficits Psychiatric: Speech and behavior appropriate   ED Course   Medications - No data to display  No orders of the defined types were placed in this encounter.   No results found for this or any previous visit (from the past 24 hour(s)). No results found.  ED Clinical Impression  1. Hordeolum externum of left upper eyelid      ED Assessment/Plan  Patient with an incoming stye.  Lid hygiene, erythromycin ointment, warm compresses.  Follow-up with PCP Or ophthalmology as needed.   Discussed MDM, treatment plan, and plan for follow-up with patient. patient agrees with plan.   Meds ordered this encounter  Medications   erythromycin ophthalmic ointment    Sig: 1 cm ribbon to affected eyelid qid x 10 days    Dispense:  5 g    Refill:  0      *This clinic note was created using Scientist, clinical (histocompatibility and immunogenetics). Therefore, there may be occasional mistakes despite careful proofreading.  ?    Domenick Gong, MD 11/01/21 1106

## 2021-11-01 NOTE — Discharge Instructions (Addendum)
Start doing warm compresses 4 times a day. You may get some baby shampoo, dilute this with warm water, and gently wipe your upper and lower eyelid while you are taking a shower. This will help prevent the recurrence of any styes or chalazions.  Follow-up with your doctor as needed.  Follow up with ophthalmologist if this this becomes severe.  Try not to rub your eyes. You may use Systane as often as you want for comfort. Return immediately to the ER for fever above 100.4, if you have pain moving your eyes, any visual changes, nausea, vomiting, headaches, a rash around your eye, or any other concerns.  Go to www.goodrx.com  or www.costplusdrugs.com to look up your medications. This will give you a list of where you can find your prescriptions at the most affordable prices. Or ask the pharmacist what the cash price is, or if they have any other discount programs available to help make your medication more affordable. This can be less expensive than what you would pay with insurance.

## 2022-01-16 ENCOUNTER — Ambulatory Visit: Payer: BC Managed Care – PPO | Admitting: Obstetrics and Gynecology

## 2022-01-19 ENCOUNTER — Other Ambulatory Visit: Payer: Self-pay | Admitting: Obstetrics and Gynecology

## 2022-03-16 ENCOUNTER — Other Ambulatory Visit: Payer: Self-pay | Admitting: Obstetrics and Gynecology

## 2022-03-16 DIAGNOSIS — Z1231 Encounter for screening mammogram for malignant neoplasm of breast: Secondary | ICD-10-CM

## 2022-03-16 DIAGNOSIS — N6489 Other specified disorders of breast: Secondary | ICD-10-CM

## 2022-03-19 DIAGNOSIS — E041 Nontoxic single thyroid nodule: Secondary | ICD-10-CM | POA: Insufficient documentation

## 2022-03-19 NOTE — Progress Notes (Unsigned)
   PCP:  Pcp, No   No chief complaint on file.    HPI:      Kerri Graves is a 41 y.o. G1P1001 whose LMP was No LMP recorded., presents today for her annual examination.  Her menses are {norm/abn:715}, lasting {number: 22536} days.  Dysmenorrhea {dysmen:716}. She {does:18564} have intermenstrual bleeding.  Sex activity: single partner, contraception - Ortho-Evra patches weekly.  Last Pap: 06/26/18 Results were: no abnormalities /neg HPV DNA  Hx of STDs: {STD hx:14358}  Last mammogram: 01/21/21  Results were: cat 3 RT breast; addl imaging 08/25/21 was normal; bilat dx mammo due 8/23 There is no FH of breast cancer. There is no FH of ovarian cancer. The patient {does:18564} do self-breast exams.  Tobacco use: {tob:20664} Alcohol use: {Alcohol:11675} No drug use.  Exercise: {exercise:31265}  She {does:18564} get adequate calcium and Vitamin D in her diet. Hx of LT thyroid cyst, not requiring bx on 2/23 u/s.   Patient Active Problem List   Diagnosis Date Noted   Annual physical exam 05/10/2017    No past surgical history on file.  Family History  Problem Relation Age of Onset   Breast cancer Neg Hx     Social History   Socioeconomic History   Marital status: Married    Spouse name: Not on file   Number of children: Not on file   Years of education: Not on file   Highest education level: Not on file  Occupational History   Not on file  Tobacco Use   Smoking status: Never   Smokeless tobacco: Never  Vaping Use   Vaping Use: Never used  Substance and Sexual Activity   Alcohol use: No   Drug use: No   Sexual activity: Yes    Birth control/protection: Patch  Other Topics Concern   Not on file  Social History Narrative   Not on file   Social Determinants of Health   Financial Resource Strain: Not on file  Food Insecurity: Not on file  Transportation Needs: Not on file  Physical Activity: Not on file  Stress: Not on file  Social Connections: Not on  file  Intimate Partner Violence: Not on file     Current Outpatient Medications:    erythromycin ophthalmic ointment, 1 cm ribbon to affected eyelid qid x 10 days, Disp: 5 g, Rfl: 0   ZAFEMY 150-35 MCG/24HR transdermal patch, UNWRAP AND APPLY 1 PATCH TO THE SKIN ONCE A WEEK FOR 3 WEEKS ON AND 1 WEEK OFF AS DIRECTED, Disp: 9 patch, Rfl: 0     ROS:  Review of Systems BREAST: No symptoms   Objective: There were no vitals taken for this visit.   OBGyn Exam  Results: No results found for this or any previous visit (from the past 24 hour(s)).  Assessment/Plan: No diagnosis found.  No orders of the defined types were placed in this encounter.            GYN counsel {counseling: 16159}     F/U  No follow-ups on file.  Song Myre B. Edilia Ghuman, PA-C 03/19/2022 1:27 PM

## 2022-03-20 ENCOUNTER — Ambulatory Visit (INDEPENDENT_AMBULATORY_CARE_PROVIDER_SITE_OTHER): Payer: BC Managed Care – PPO | Admitting: Obstetrics and Gynecology

## 2022-03-20 ENCOUNTER — Encounter: Payer: Self-pay | Admitting: Obstetrics and Gynecology

## 2022-03-20 VITALS — BP 100/70 | Ht 65.0 in | Wt 133.0 lb

## 2022-03-20 DIAGNOSIS — Z3045 Encounter for surveillance of transdermal patch hormonal contraceptive device: Secondary | ICD-10-CM | POA: Diagnosis not present

## 2022-03-20 DIAGNOSIS — E041 Nontoxic single thyroid nodule: Secondary | ICD-10-CM

## 2022-03-20 DIAGNOSIS — Z01411 Encounter for gynecological examination (general) (routine) with abnormal findings: Secondary | ICD-10-CM

## 2022-03-20 DIAGNOSIS — Z01419 Encounter for gynecological examination (general) (routine) without abnormal findings: Secondary | ICD-10-CM

## 2022-03-20 DIAGNOSIS — Z1231 Encounter for screening mammogram for malignant neoplasm of breast: Secondary | ICD-10-CM

## 2022-03-20 DIAGNOSIS — R928 Other abnormal and inconclusive findings on diagnostic imaging of breast: Secondary | ICD-10-CM

## 2022-03-20 MED ORDER — ZAFEMY 150-35 MCG/24HR TD PTWK: MEDICATED_PATCH | TRANSDERMAL | 3 refills | Status: DC

## 2022-03-20 NOTE — Patient Instructions (Signed)
I value your feedback and you entrusting us with your care. If you get a [facility] patient survey, I would appreciate you taking the time to let us know about your experience today. Thank you!  [facility]: [phone]      

## 2022-04-10 ENCOUNTER — Ambulatory Visit
Admission: RE | Admit: 2022-04-10 | Discharge: 2022-04-10 | Disposition: A | Discharge status: Home / Self Care | Payer: BC Managed Care – PPO | Source: Ambulatory Visit | Attending: Obstetrics and Gynecology | Admitting: Obstetrics and Gynecology

## 2022-04-10 ENCOUNTER — Encounter: Payer: Self-pay | Admitting: Obstetrics and Gynecology

## 2022-04-10 DIAGNOSIS — N6489 Other specified disorders of breast: Secondary | ICD-10-CM | POA: Insufficient documentation

## 2022-04-10 DIAGNOSIS — R928 Other abnormal and inconclusive findings on diagnostic imaging of breast: Secondary | ICD-10-CM | POA: Insufficient documentation

## 2022-04-10 DIAGNOSIS — Z1231 Encounter for screening mammogram for malignant neoplasm of breast: Secondary | ICD-10-CM | POA: Insufficient documentation

## 2022-04-19 ENCOUNTER — Other Ambulatory Visit: Payer: Self-pay | Admitting: Obstetrics and Gynecology

## 2022-04-19 DIAGNOSIS — Z3045 Encounter for surveillance of transdermal patch hormonal contraceptive device: Secondary | ICD-10-CM

## 2023-04-04 ENCOUNTER — Other Ambulatory Visit: Payer: Self-pay | Admitting: Obstetrics and Gynecology

## 2023-04-04 ENCOUNTER — Other Ambulatory Visit: Payer: Self-pay

## 2023-04-04 DIAGNOSIS — Z3045 Encounter for surveillance of transdermal patch hormonal contraceptive device: Secondary | ICD-10-CM

## 2023-04-04 MED ORDER — NORELGESTROMIN-ETH ESTRADIOL 150-35 MCG/24HR TD PTWK: MEDICATED_PATCH | TRANSDERMAL | 0 refills | Status: DC

## 2023-04-04 NOTE — Telephone Encounter (Signed)
Pt caling for refill of her bc to get her to her appt in Jan.  [phone]  Left detailed msg refill eRx'd.

## 2023-06-16 NOTE — Progress Notes (Signed)
 PCP:  Pcp, No   Chief Complaint  Patient presents with   Gynecologic Exam    No concerns     HPI:      [name] is a 43 y.o. G1P1001 whose LMP was Patient's last menstrual period was 05/26/2023 (approximate)., presents today for her annual examination.  Her menses are regular every 28-30 days, lasting 3-4 days, getting lighter.  Dysmenorrhea none. She does not have intermenstrual bleeding. No vasomotor sx.   Sex activity: single partner, contraception - Ortho-Evra patches weekly. No pain/bleeding.  Last Pap: 06/26/18 Results were: no abnormalities /neg HPV DNA . No hx of abn paps.   Last mammogram: 04/10/22 Results were: cat 3 RT breast; bilat dx mammo due 10/24 for 2 yr stability  Pt has limited FH due to adoption. There is no FH of breast cancer. There is no FH of ovarian cancer. The patient does do self-breast exams.  Tobacco use: The patient denies current or previous tobacco use. Alcohol use: social drinker No drug use.  Exercise: moderately active  She does get adequate calcium and Vitamin D  in her diet. Hx of LT thyroid cyst, not requiring bx for f/u on 2/23 u/s. Getting smaller.  Patient Active Problem List   Diagnosis Date Noted   Thyroid cyst 03/19/2022   Annual physical exam 05/10/2017    Past Surgical History:  Procedure Laterality Date   NO PAST SURGERIES      Family History  Problem Relation Age of Onset   Breast cancer Neg Hx     Social History   Socioeconomic History   Marital status: Married    Spouse name: Not on file   Number of children: Not on file   Years of education: Not on file   Highest education level: Not on file  Occupational History   Not on file  Tobacco Use   Smoking status: Never   Smokeless tobacco: Never  Vaping Use   Vaping status: Never Used  Substance and Sexual Activity   Alcohol use: Yes    Comment: occ   Drug use: No   Sexual activity: Yes    Birth control/protection: Patch  Other Topics Concern    Not on file  Social History Narrative   Not on file   Social Drivers of Health   Financial Resource Strain: Not on file  Food Insecurity: Not on file  Transportation Needs: Not on file  Physical Activity: Not on file  Stress: Not on file  Social Connections: Not on file  Intimate Partner Violence: Unknown (10/07/2021)   [facility]   Intimate Partner Violence    Fear of Current or Ex-Partner: Not on file     Current Outpatient Medications:    norelgestromin -ethinyl estradiol  (ZAFEMY ) 150-35 MCG/24HR transdermal patch, Place 1 patch onto the skin once a week., Disp: 9 patch, Rfl: 0     ROS:  Review of Systems  Constitutional:  Negative for fatigue, fever and unexpected weight change.  Respiratory:  Negative for cough, shortness of breath and wheezing.   Cardiovascular:  Negative for chest pain, palpitations and leg swelling.  Gastrointestinal:  Negative for blood in stool, constipation, diarrhea, nausea and vomiting.  Endocrine: Negative for cold intolerance, heat intolerance and polyuria.  Genitourinary:  Negative for dyspareunia, dysuria, flank pain, frequency, genital sores, hematuria, menstrual problem, pelvic pain, urgency, vaginal bleeding, vaginal discharge and vaginal pain.  Musculoskeletal:  Negative for back pain, joint swelling and myalgias.  Skin:  Negative for rash.  Neurological:  Negative for dizziness, syncope, light-headedness, numbness and headaches.  Hematological:  Negative for adenopathy.  Psychiatric/Behavioral:  Negative for agitation, confusion, sleep disturbance and suicidal ideas. The patient is not nervous/anxious.    BREAST: No symptoms   Objective: BP (!) 157/93   Pulse 87   Ht 5' 5 (1.651 m)   Wt 151 lb (68.5 kg)   LMP 05/26/2023 (Approximate)   BMI 25.13 kg/m  2nd BP is 157/93  Physical Exam Constitutional:      Appearance: She is well-developed.  Genitourinary:     Vulva normal.     Right Labia: No  rash, tenderness or lesions.    Left Labia: No tenderness, lesions or rash.    No vaginal discharge, erythema or tenderness.      Right Adnexa: not tender and no mass present.    Left Adnexa: not tender and no mass present.    No cervical friability or polyp.     Uterus is not enlarged or tender.  Breasts:    Right: No mass, nipple discharge, skin change or tenderness.     Left: No mass, nipple discharge, skin change or tenderness.  HENT:     Head:   Neck:     Thyroid: Thyroid mass present. No thyromegaly.  Cardiovascular:     Rate and Rhythm: Normal rate and regular rhythm.     Heart sounds: Normal heart sounds. No murmur heard. Pulmonary:     Effort: Pulmonary effort is normal.     Breath sounds: Normal breath sounds.  Abdominal:     Palpations: Abdomen is soft.     Tenderness: There is no abdominal tenderness. There is no guarding or rebound.  Musculoskeletal:        General: Normal range of motion.     Cervical back: Normal range of motion.  Lymphadenopathy:     Cervical: No cervical adenopathy.  Neurological:     General: No focal deficit present.     Mental Status: She is alert and oriented to person, place, and time.     Cranial Nerves: No cranial nerve deficit.  Skin:    General: Skin is warm and dry.  Psychiatric:        Mood and Affect: Mood normal.        Behavior: Behavior normal.        Thought Content: Thought content normal.        Judgment: Judgment normal.  Vitals reviewed.     Assessment/Plan: Encounter for annual routine gynecological examination  Cervical cancer screening - Plan: Cytology - PAP  Screening for HPV (human papillomavirus) - Plan: Cytology - PAP  Encounter for surveillance of transdermal patch hormonal contraceptive device - Plan: norelgestromin -ethinyl estradiol  (ZAFEMY ) 150-35 MCG/24HR transdermal patch; Rx RF. Will re-eval after f/u BP check.   Encounter for screening mammogram for malignant neoplasm of breast - Plan: MM 3D  DIAGNOSTIC MAMMOGRAM BILATERAL BREAST; pt to schedule mammo  Abnormal mammogram of right breast - Plan: MM 3D DIAGNOSTIC MAMMOGRAM BILATERAL BREAST  Thyroid cyst--getting smaller, no f/u needed per last u/s.   Elevated blood pressure reading--RTO in 1 wk for BP recheck. Drank coffee before appt, no known FH HTN    Meds ordered this encounter  Medications   norelgestromin -ethinyl estradiol  (ZAFEMY ) 150-35 MCG/24HR transdermal patch    Sig: Place 1 patch onto the skin once a week.    Dispense:  9 patch    Refill:  0    Supervising Provider:  CHERRY, ANIKA [[id number]]             GYN counsel breast self exam, mammography screening, adequate intake of calcium and vitamin D , diet and exercise     F/U  Return in about 1 week (around 06/24/2023) for BP recheck with me.  Zuriel Yeaman B. Zanylah Hardie, PA-C 06/17/2023 4:32 PM

## 2023-06-17 ENCOUNTER — Encounter: Payer: Self-pay | Admitting: Obstetrics and Gynecology

## 2023-06-17 ENCOUNTER — Other Ambulatory Visit: Payer: Self-pay | Admitting: Obstetrics and Gynecology

## 2023-06-17 ENCOUNTER — Ambulatory Visit: Payer: BC Managed Care – PPO | Admitting: Obstetrics and Gynecology

## 2023-06-17 ENCOUNTER — Other Ambulatory Visit (HOSPITAL_COMMUNITY)
Admission: RE | Admit: 2023-06-17 | Discharge: 2023-06-17 | Disposition: A | Discharge status: Home / Self Care | Payer: 59 | Source: Ambulatory Visit | Attending: Obstetrics and Gynecology | Admitting: Obstetrics and Gynecology

## 2023-06-17 VITALS — BP 157/93 | HR 87 | Ht 65.0 in | Wt 151.0 lb

## 2023-06-17 DIAGNOSIS — Z3045 Encounter for surveillance of transdermal patch hormonal contraceptive device: Secondary | ICD-10-CM

## 2023-06-17 DIAGNOSIS — Z124 Encounter for screening for malignant neoplasm of cervix: Secondary | ICD-10-CM | POA: Insufficient documentation

## 2023-06-17 DIAGNOSIS — Z1151 Encounter for screening for human papillomavirus (HPV): Secondary | ICD-10-CM

## 2023-06-17 DIAGNOSIS — N6489 Other specified disorders of breast: Secondary | ICD-10-CM

## 2023-06-17 DIAGNOSIS — Z1231 Encounter for screening mammogram for malignant neoplasm of breast: Secondary | ICD-10-CM

## 2023-06-17 DIAGNOSIS — R928 Other abnormal and inconclusive findings on diagnostic imaging of breast: Secondary | ICD-10-CM

## 2023-06-17 DIAGNOSIS — Z01419 Encounter for gynecological examination (general) (routine) without abnormal findings: Secondary | ICD-10-CM

## 2023-06-17 DIAGNOSIS — R030 Elevated blood-pressure reading, without diagnosis of hypertension: Secondary | ICD-10-CM

## 2023-06-17 DIAGNOSIS — E041 Nontoxic single thyroid nodule: Secondary | ICD-10-CM

## 2023-06-17 MED ORDER — NORELGESTROMIN-ETH ESTRADIOL 150-35 MCG/24HR TD PTWK: 1.0000 | MEDICATED_PATCH | TRANSDERMAL | 0 refills | Status: DC

## 2023-06-17 NOTE — Patient Instructions (Signed)
 I value your feedback and you entrusting [place] with your care. If you get a [facility] patient survey, I would appreciate you taking the time to let us know about your experience today. Thank you!  [facility] ([place])--[phone]

## 2023-06-19 LAB — CYTOLOGY - PAP
Comment: NEGATIVE
Diagnosis: NEGATIVE
High risk HPV: NEGATIVE

## 2023-06-24 NOTE — Progress Notes (Signed)
 Pcp, No   Chief Complaint  Patient presents with   Blood Pressure Check    HPI:      [name] is a 43 y.o. G1P1001 whose LMP was Patient's last menstrual period was 05/26/2023 (approximate)., presents today for 1 wk BP f/u. Had elevated BP at 06/17/23 annual which is unusual for pt. Not exercising as much, increased sodium in food eating school cafeteria lunches. No prior hx of HTN. On xulane .  06/17/23 BP (!) 157/93; 2nd BP is 157/93   Patient Active Problem List   Diagnosis Date Noted   Thyroid cyst 03/19/2022   Annual physical exam 05/10/2017    Past Surgical History:  Procedure Laterality Date   NO PAST SURGERIES      Family History  Problem Relation Age of Onset   Breast cancer Neg Hx     Social History   Socioeconomic History   Marital status: Married    Spouse name: Not on file   Number of children: Not on file   Years of education: Not on file   Highest education level: Not on file  Occupational History   Not on file  Tobacco Use   Smoking status: Never   Smokeless tobacco: Never  Vaping Use   Vaping status: Never Used  Substance and Sexual Activity   Alcohol use: Yes    Comment: occ   Drug use: No   Sexual activity: Yes    Birth control/protection: Patch  Other Topics Concern   Not on file  Social History Narrative   Not on file   Social Drivers of Health   Financial Resource Strain: Not on file  Food Insecurity: Not on file  Transportation Needs: Not on file  Physical Activity: Not on file  Stress: Not on file  Social Connections: Not on file  Intimate Partner Violence: Unknown (10/07/2021)   [facility]   Intimate Partner Violence    Fear of Current or Ex-Partner: Not on file    Outpatient Medications Prior to Visit  Medication Sig Dispense Refill   norelgestromin -ethinyl estradiol  (ZAFEMY ) 150-35 MCG/24HR transdermal patch Place 1 patch onto the skin once a week. 9 patch 0   No  facility-administered medications prior to visit.      ROS:  Review of Systems  Constitutional:  Negative for fever.  Gastrointestinal:  Negative for blood in stool, constipation, diarrhea, nausea and vomiting.  Genitourinary:  Negative for dyspareunia, dysuria, flank pain, frequency, hematuria, urgency, vaginal bleeding, vaginal discharge and vaginal pain.  Musculoskeletal:  Negative for back pain.  Skin:  Negative for rash.   BREAST: No symptoms   OBJECTIVE:   Vitals:  BP 138/88   Pulse 71   Ht 5' 5 (1.651 m)   Wt 151 lb (68.5 kg)   LMP 05/26/2023 (Approximate)   BMI 25.13 kg/m   Physical Exam Constitutional:      Appearance: Normal appearance.  Pulmonary:     Effort: Pulmonary effort is normal.  Musculoskeletal:        General: Normal range of motion.  Neurological:     Mental Status: She is alert and oriented to person, place, and time.  Psychiatric:        Judgment: Judgment normal.     Assessment/Plan: Blood pressure check--improved today. Keep BP check at home. F/u if >140/90. Increase exercise, DASH diet. Cont xulane  for now. F/u prn.     Return if symptoms worsen or fail to improve.  Prather Failla B. Roddrick Sharron, PA-C 06/25/2023 4:36 PM

## 2023-06-25 ENCOUNTER — Encounter: Payer: Self-pay | Admitting: Obstetrics and Gynecology

## 2023-06-25 ENCOUNTER — Ambulatory Visit: Payer: 59 | Admitting: Obstetrics and Gynecology

## 2023-06-25 VITALS — BP 138/88 | HR 71 | Ht 65.0 in | Wt 151.0 lb

## 2023-06-25 DIAGNOSIS — Z0130 Encounter for examination of blood pressure without abnormal findings: Secondary | ICD-10-CM

## 2023-06-25 DIAGNOSIS — Z3045 Encounter for surveillance of transdermal patch hormonal contraceptive device: Secondary | ICD-10-CM

## 2023-06-25 MED ORDER — NORELGESTROMIN-ETH ESTRADIOL 150-35 MCG/24HR TD PTWK: 1.0000 | MEDICATED_PATCH | TRANSDERMAL | 2 refills | Status: DC

## 2023-07-01 ENCOUNTER — Ambulatory Visit
Admission: RE | Admit: 2023-07-01 | Discharge: 2023-07-01 | Disposition: A | Discharge status: Home / Self Care | Payer: 59 | Source: Ambulatory Visit | Attending: Obstetrics and Gynecology | Admitting: Obstetrics and Gynecology

## 2023-07-01 DIAGNOSIS — N6489 Other specified disorders of breast: Secondary | ICD-10-CM | POA: Diagnosis present

## 2023-07-02 ENCOUNTER — Encounter: Payer: Self-pay | Admitting: Obstetrics and Gynecology

## 2023-10-24 IMAGING — MG MM DIGITAL DIAGNOSTIC UNILAT*R* W/ TOMO W/ CAD
4 series · 4 of 12 positions shown · non-contrast
Comparison: Prior films

CLINICAL DATA: Short-term follow-up right breast asymmetry

EXAM:
DIGITAL DIAGNOSTIC UNILATERAL RIGHT MAMMOGRAM WITH TOMOSYNTHESIS AND
CAD
TECHNIQUE: Right digital diagnostic mammography and breast tomosynthesis was
performed. The images were evaluated with computer-aided detection.

[R CC synth-2D]
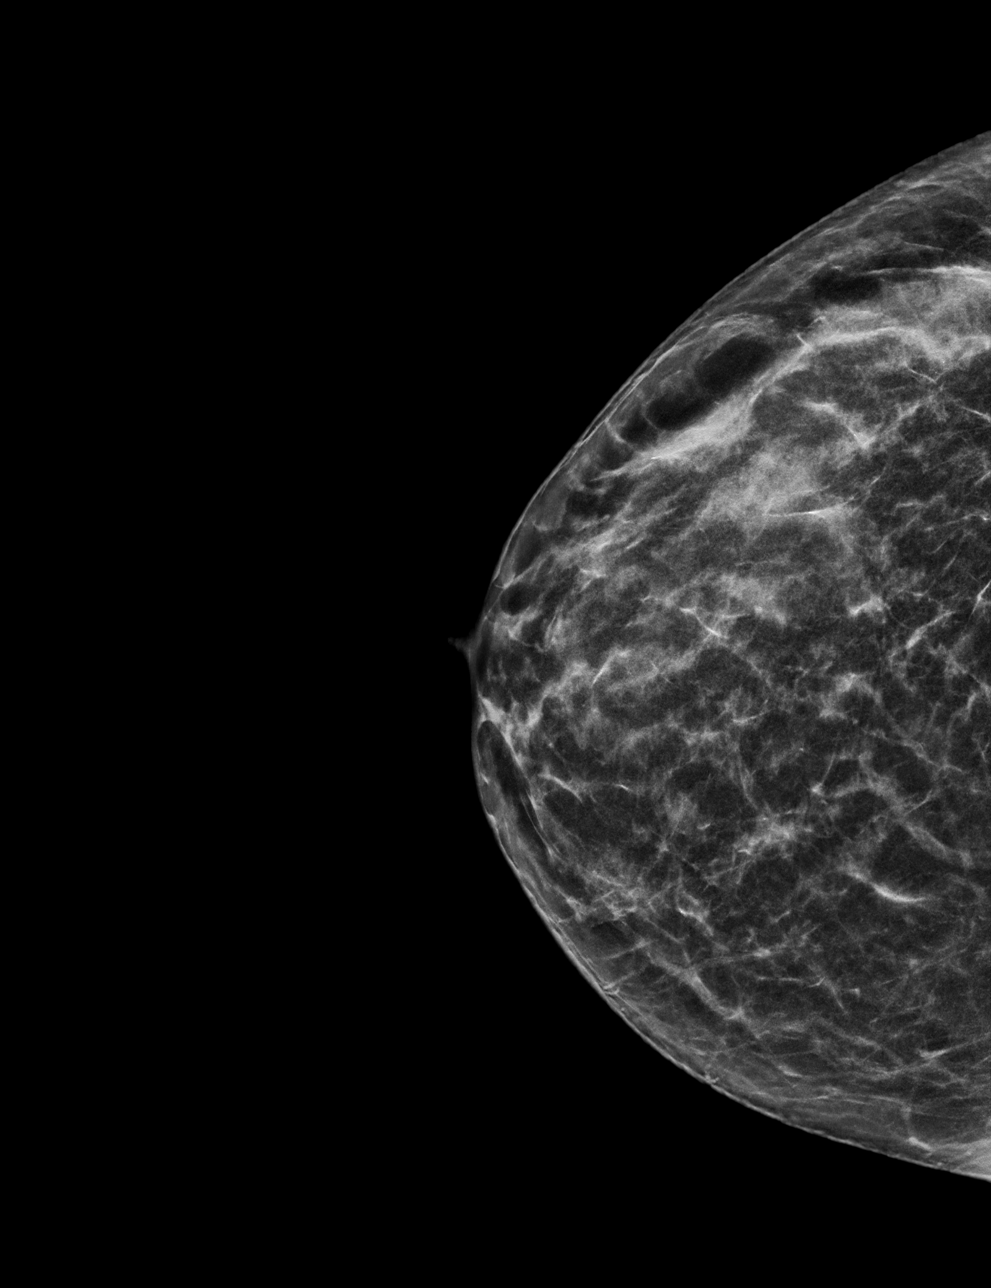

[R MLO synth-2D]
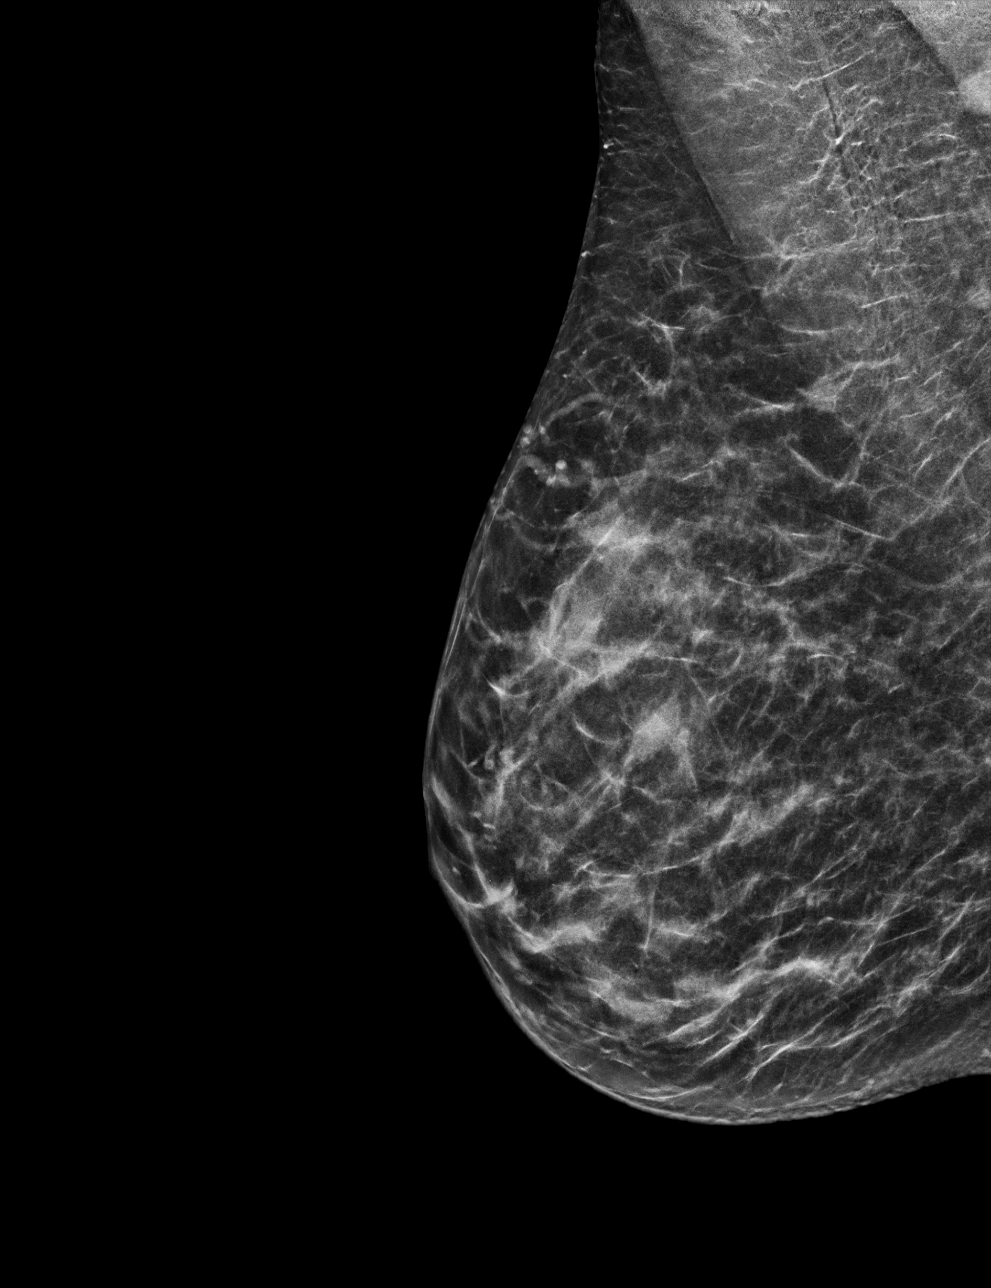

[R MLO tomo · tomo slice 29/56.0]
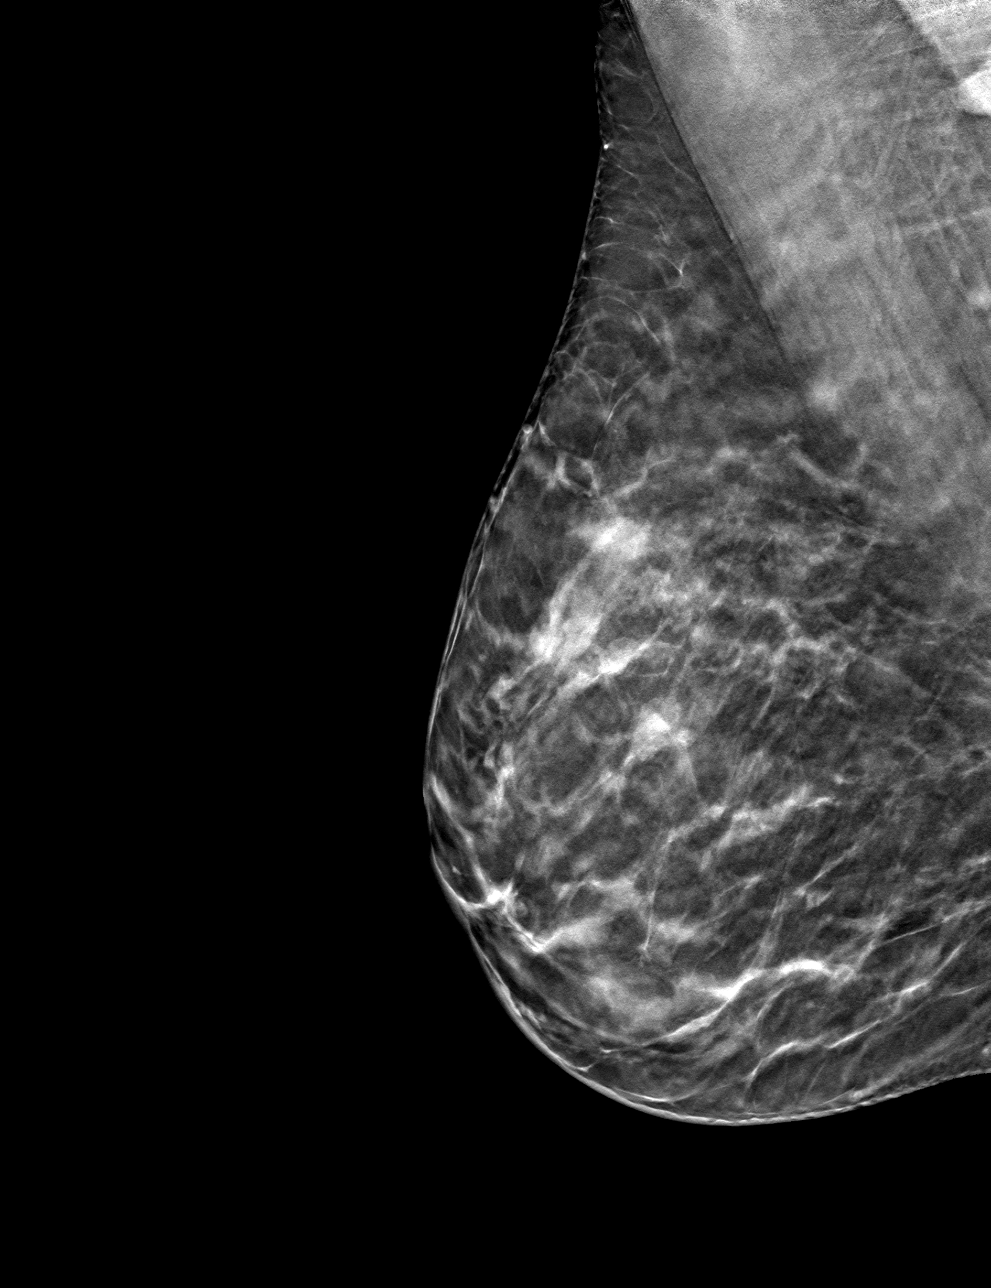

[R CC tomo · tomo slice 27/53.0]
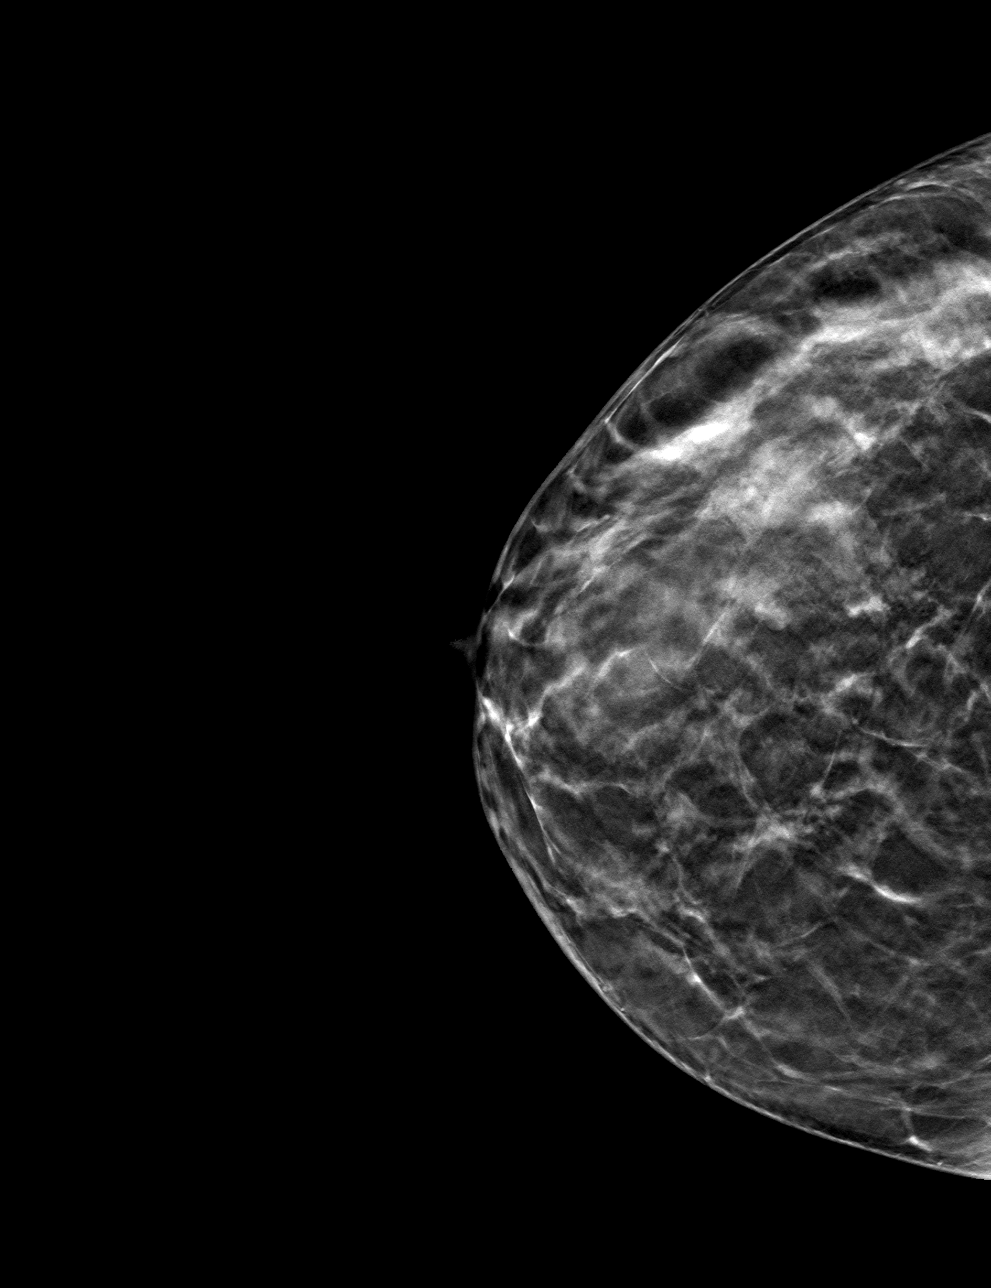

[4 of 12 positions shown; findings below may reference images not displayed]

ACR Breast Density Category c: The breast tissue is heterogeneously
dense, which may obscure small masses.
FINDINGS: Cc and MLO views of the right breast are submitted. The previously
questioned asymmetry in the medial anterior right breast is stable.
IMPRESSION: Probable benign findings.

RECOMMENDATION:
Bilateral diagnostic mammogram in six-months.

I have discussed the findings and recommendations with the patient.
If applicable, a reminder letter will be sent to the patient
regarding the next appointment.

BI-RADS CATEGORY  3: Probably benign.

## 2024-06-01 ENCOUNTER — Encounter: Payer: Self-pay | Admitting: Obstetrics and Gynecology

## 2024-06-02 ENCOUNTER — Other Ambulatory Visit: Payer: Self-pay | Admitting: Obstetrics and Gynecology

## 2024-06-02 DIAGNOSIS — Z3045 Encounter for surveillance of transdermal patch hormonal contraceptive device: Secondary | ICD-10-CM

## 2024-06-17 NOTE — Progress Notes (Signed)
 "  ANNUAL GYNECOLOGICAL EXAM  HPI  Kerri Graves is a 44 y.o.-year-old G1P1001 who presents for an annual gynecological exam today.  She denies pelvic pain, dyspareunia, abnormal vaginal bleeding or discharge, and UTI symptoms. She's using Xulane  patch for contraception. Doing well and would like to continue. Would like Cologard for colon cancer screening.  She has a history of thyroid goiter. This was last imaged 2 years ago, and she has been followed by ENT whom she's seen within the last year. She was told that she doesn't need any further follow up.   Medical/Surgical History Past Medical History:  Diagnosis Date   Allergy    Goiter    Past Surgical History:  Procedure Laterality Date   NO PAST SURGERIES      Social History Lives with Husband and Daughter. Feels safe at home Work: Engineer, site Exercise: 2-3 days, 60 mins a day Substances: Denies EtOH, tobacco, vape, and recreational drugs Social Drivers of Health   Tobacco Use: Low Risk (06/18/2024)   Patient History    Smoking Tobacco Use: Never    Smokeless Tobacco Use: Never    Passive Exposure: Not on file  Financial Resource Strain: Not on file  Food Insecurity: No Food Insecurity (06/18/2024)   Epic    Worried About Programme Researcher, Broadcasting/film/video in the Last Year: Never true    Ran Out of Food in the Last Year: Never true  Transportation Needs: No Transportation Needs (06/18/2024)   Epic    Lack of Transportation (Medical): No    Lack of Transportation (Non-Medical): No  Physical Activity: Sufficiently Active (06/18/2024)   Exercise Vital Sign    Days of Exercise per Week: 3 days    Minutes of Exercise per Session: 60 min  Stress: No Stress Concern Present (06/18/2024)   [employer] of Occupational Health - Occupational Stress Questionnaire    Feeling of Stress: Not at all  Social Connections: Not on file  Depression (PHQ2-9): Low Risk (06/18/2024)   Depression (PHQ2-9)    PHQ-2 Score: 0  Alcohol Screen: Low Risk  (06/18/2024)   Alcohol Screen    Last Alcohol Screening Score (AUDIT): 1  Housing: Low Risk (06/18/2024)   Epic    Unable to Pay for Housing in the Last Year: No    Number of Times Moved in the Last Year: 0    Homeless in the Last Year: No  Utilities: Not At Risk (06/18/2024)   Epic    Threatened with loss of utilities: No  Health Literacy: Adequate Health Literacy (06/18/2024)   B1300 Health Literacy    Frequency of need for help with medical instructions: Never     Obstetric History OB History     Gravida  1   Para  1   Term  1   Preterm      AB      Living  1      SAB      IAB      Ectopic      Multiple      Live Births  1            GYN/Menstrual History Patient's last menstrual period was 06/10/2024 (exact date). Last Pap: 2025, WNL Contraception: Patch  Prevention Dentist Next appointment 12/2024 Eye exam Will schedule Mammogram Will schedule Colon cancer screening: Cologard Flu shot/vaccines 03/2024  Current Medications Show/hide medication list[1]      ROS Constitutional: Denied constitutional symptoms, night sweats, recent illness, fatigue, fever,  insomnia and weight loss.  Eyes: Denied eye symptoms, eye pain, photophobia, vision change and visual disturbance.  Ears/Nose/Throat/Neck: Denied ear, nose, throat or neck symptoms, hearing loss, nasal discharge, sinus congestion and sore throat.  Cardiovascular: Denied cardiovascular symptoms, arrhythmia, chest pain/pressure, edema, exercise intolerance, orthopnea and palpitations.  Respiratory: Denied pulmonary symptoms, asthma, pleuritic pain, productive sputum, cough, dyspnea and wheezing.  Gastrointestinal: Denied gastro-esophageal reflux, melena, nausea and vomiting.  Genitourinary: Denied genitourinary symptoms including symptomatic vaginal discharge, pelvic relaxation issues, and urinary complaints.  Musculoskeletal: Denied musculoskeletal symptoms, stiffness, swelling, muscle weakness and  myalgia.  Dermatologic: Denied dermatology symptoms, rash and scar.  Neurologic: Denied neurology symptoms, dizziness, headache, neck pain and syncope.  Psychiatric: Denied psychiatric symptoms, anxiety and depression.  Endocrine: Denied endocrine symptoms including hot flashes and night sweats.    OBJECTIVE  BP 123/85   Pulse 93   Ht 5' 5 (1.651 m)   Wt 150 lb 11.2 oz (68.4 kg)   LMP 06/10/2024 (Exact Date)   BMI 25.08 kg/m    Physical examination General NAD, Conversant  HEENT Atraumatic; Op clear with mmm.  Normo-cephalic. Pupils reactive. Anicteric sclerae  Thyroid/Neck Smooth without nodularity or enlargement. Normal ROM.  Neck Supple.  Skin No rashes, lesions or ulceration. Normal palpated skin turgor. No nodularity.  Breasts: No masses or discharge.  Symmetric.  No axillary adenopathy.  Lungs: Clear to auscultation.No rales or wheezes. Normal Respiratory effort, no retractions.  Heart: NSR.  No murmurs or rubs appreciated. No peripheral edema  Abdomen: Soft.  Non-tender.  No masses.  No HSM. No hernia  Extremities: Moves all appropriately.  Normal ROM for age. No lymphadenopathy.  Neuro: Oriented to PPT.  Normal mood. Normal affect.     Pelvic:   Vulva: Normal appearance.  No lesions.  Support: Normal pelvic support.    ASSESSMENT/ PLAN  1) Physical exam as noted. Discussed healthy lifestyle choices and preventive care. 2) Pap w/ cotesting done last year. Due next 06/2028 3) Mammogram ordered today 4) Colon cancer screening- would like Cologard. Ordered. 5) Continue with contraceptive patch. Rx sent.  6) Return in one year for annual exam or as needed for concerns.   Lauraine Lakes, CNM      [1]  Outpatient Medications Prior to Visit  Medication Sig   [DISCONTINUED] norelgestromin -ethinyl estradiol  (XULANE ) 150-35 MCG/24HR transdermal patch APPLY 1 PATCH TOPICALLY TO THE SKIN 1 TIME A WEEK. 3 WEEKS ON 1 WEEK OFF PATCH   No facility-administered medications  prior to visit.   "

## 2024-06-18 ENCOUNTER — Encounter: Payer: Self-pay | Admitting: Registered Nurse

## 2024-06-18 ENCOUNTER — Ambulatory Visit (INDEPENDENT_AMBULATORY_CARE_PROVIDER_SITE_OTHER): Admitting: Registered Nurse

## 2024-06-18 VITALS — BP 123/85 | HR 93 | Ht 65.0 in | Wt 150.7 lb

## 2024-06-18 DIAGNOSIS — Z01419 Encounter for gynecological examination (general) (routine) without abnormal findings: Secondary | ICD-10-CM | POA: Diagnosis not present

## 2024-06-18 DIAGNOSIS — Z1231 Encounter for screening mammogram for malignant neoplasm of breast: Secondary | ICD-10-CM | POA: Insufficient documentation

## 2024-06-18 DIAGNOSIS — Z1211 Encounter for screening for malignant neoplasm of colon: Secondary | ICD-10-CM | POA: Insufficient documentation

## 2024-06-18 DIAGNOSIS — Z124 Encounter for screening for malignant neoplasm of cervix: Secondary | ICD-10-CM | POA: Insufficient documentation

## 2024-06-18 DIAGNOSIS — Z1151 Encounter for screening for human papillomavirus (HPV): Secondary | ICD-10-CM | POA: Insufficient documentation

## 2024-06-18 DIAGNOSIS — Z3045 Encounter for surveillance of transdermal patch hormonal contraceptive device: Secondary | ICD-10-CM | POA: Insufficient documentation

## 2024-06-18 MED ORDER — NORELGESTROMIN-ETH ESTRADIOL 150-35 MCG/24HR TD PTWK: 1.0000 | MEDICATED_PATCH | TRANSDERMAL | 4 refills | Status: AC

## 2024-06-18 NOTE — Patient Instructions (Signed)
 Preventive Care 46-46 Years Old, Female  Preventive care refers to lifestyle choices and visits with your health care provider that can promote health and wellness. Preventive care visits are also called wellness exams.  What can I expect for my preventive care visit?  Counseling  Your health care provider may ask you questions about your:  Medical history, including:  Past medical problems.  Family medical history.  Pregnancy history.  Current health, including:  Menstrual cycle.  Method of birth control.  Emotional well-being.  Home life and relationship well-being.  Sexual activity and sexual health.  Lifestyle, including:  Alcohol, nicotine or tobacco, and drug use.  Access to firearms.  Diet, exercise, and sleep habits.  Work and work Astronomer.  Sunscreen use.  Safety issues such as seatbelt and bike helmet use.  Physical exam  Your health care provider will check your:  Height and weight. These may be used to calculate your BMI (body mass index). BMI is a measurement that tells if you are at a healthy weight.  Waist circumference. This measures the distance around your waistline. This measurement also tells if you are at a healthy weight and may help predict your risk of certain diseases, such as type 2 diabetes and high blood pressure.  Heart rate and blood pressure.  Body temperature.  Skin for abnormal spots.  What immunizations do I need?    Vaccines are usually given at various ages, according to a schedule. Your health care provider will recommend vaccines for you based on your age, medical history, and lifestyle or other factors, such as travel or where you work.  What tests do I need?  Screening  Your health care provider may recommend screening tests for certain conditions. This may include:  Lipid and cholesterol levels.  Diabetes screening. This is done by checking your blood sugar (glucose) after you have not eaten for a while (fasting).  Pelvic exam and Pap test.  Hepatitis B test.  Hepatitis C  test.  HIV (human immunodeficiency virus) test.  STI (sexually transmitted infection) testing, if you are at risk.  Lung cancer screening.  Colorectal cancer screening.  Mammogram. Talk with your health care provider about when you should start having regular mammograms. This may depend on whether you have a family history of breast cancer.  BRCA-related cancer screening. This may be done if you have a family history of breast, ovarian, tubal, or peritoneal cancers.  Bone density scan. This is done to screen for osteoporosis.  Talk with your health care provider about your test results, treatment options, and if necessary, the need for more tests.  Follow these instructions at home:  Eating and drinking    Eat a diet that includes fresh fruits and vegetables, whole grains, lean protein, and low-fat dairy products.  Take vitamin and mineral supplements as recommended by your health care provider.  Do not drink alcohol if:  Your health care provider tells you not to drink.  You are pregnant, may be pregnant, or are planning to become pregnant.  If you drink alcohol:  Limit how much you have to 0-1 drink a day.  Know how much alcohol is in your drink. In the [place] one drink equals one 12 oz bottle of beer (355 mL), one 5 oz glass of wine (148 mL), or one 1 oz glass of hard liquor (44 mL).  Lifestyle  Brush your teeth every morning and night with fluoride toothpaste. Floss one time each day.  Exercise for at least  30 minutes 5 or more days each week.  Do not use any products that contain nicotine or tobacco. These products include cigarettes, chewing tobacco, and vaping devices, such as e-cigarettes. If you need help quitting, ask your health care provider.  Do not use drugs.  If you are sexually active, practice safe sex. Use a condom or other form of protection to prevent STIs.  If you do not wish to become pregnant, use a form of birth control. If you plan to become pregnant, see your health care provider for a  prepregnancy visit.  Take aspirin only as told by your health care provider. Make sure that you understand how much to take and what form to take. Work with your health care provider to find out whether it is safe and beneficial for you to take aspirin daily.  Find healthy ways to manage stress, such as:  Meditation, yoga, or listening to music.  Journaling.  Talking to a trusted person.  Spending time with friends and family.  Minimize exposure to UV radiation to reduce your risk of skin cancer.  Safety  Always wear your seat belt while driving or riding in a vehicle.  Do not drive:  If you have been drinking alcohol. Do not ride with someone who has been drinking.  When you are tired or distracted.  While texting.  If you have been using any mind-altering substances or drugs.  Wear a helmet and other protective equipment during sports activities.  If you have firearms in your house, make sure you follow all gun safety procedures.  Seek help if you have been physically or sexually abused.  What's next?  Visit your health care provider once a year for an annual wellness visit.  Ask your health care provider how often you should have your eyes and teeth checked.  Stay up to date on all vaccines.  This information is not intended to replace advice given to you by your health care provider. Make sure you discuss any questions you have with your health care provider.  Document Revised: 11/23/2020 Document Reviewed: 11/23/2020  Elsevier Patient Education  2024 [employer].

## 2024-07-04 ENCOUNTER — Other Ambulatory Visit: Payer: Self-pay | Admitting: Medical Genetics

## 2024-07-06 ENCOUNTER — Ambulatory Visit: Payer: Self-pay | Admitting: Registered Nurse

## 2024-07-06 DIAGNOSIS — R195 Other fecal abnormalities: Secondary | ICD-10-CM

## 2024-07-06 LAB — COLOGUARD: COLOGUARD: POSITIVE — AB

## 2024-07-07 ENCOUNTER — Other Ambulatory Visit: Payer: Self-pay

## 2024-07-07 ENCOUNTER — Telehealth: Payer: Self-pay

## 2024-07-07 DIAGNOSIS — R195 Other fecal abnormalities: Secondary | ICD-10-CM

## 2024-07-07 DIAGNOSIS — Z1211 Encounter for screening for malignant neoplasm of colon: Secondary | ICD-10-CM

## 2024-07-07 MED ORDER — NA SULFATE-K SULFATE-MG SULF 17.5-3.13-1.6 GM/177ML PO SOLN: 1.0000 | Freq: Once | ORAL | 0 refills | Status: AC

## 2024-07-07 NOTE — Telephone Encounter (Signed)
 Gastroenterology Pre-Procedure Review  Request Date: 07/22/24 Requesting Physician: Dr. Melany  PATIENT REVIEW QUESTIONS: The patient responded to the following health history questions as indicated:    1. Are you having any GI issues? Positive Cologuard, 1st Colonoscopy. No GI Issues. 2. Do you have a personal history of Polyps? no 3. Do you have a family history of Colon Cancer or Polyps? Unsure of fathers side of family bc she was adopted by her maternal aunt but no history of mother's side 4. Diabetes Mellitus? no 5. Joint replacements in the past 12 months?no 6. Major health problems in the past 3 months?no 7. Any artificial heart valves, MVP, or defibrillator?no    MEDICATIONS & ALLERGIES:    Patient reports the following regarding taking any anticoagulation/antiplatelet therapy:   Plavix, Coumadin, Eliquis, Xarelto, Lovenox, Pradaxa, Brilinta, or Effient? no Aspirin? no  Patient confirms/reports the following medications:  Current Outpatient Medications  Medication Sig Dispense Refill   Na Sulfate-K Sulfate-Mg Sulfate concentrate (SUPREP) 17.5-3.13-1.6 GM/177ML SOLN Take 1 kit (354 mLs total) by mouth once for 1 dose. 354 mL 0   norelgestromin -ethinyl estradiol  (XULANE ) 150-35 MCG/24HR transdermal patch Place 1 patch onto the skin once a week. 12 patch 4   No current facility-administered medications for this visit.    Patient confirms/reports the following allergies:  Allergies[1]  No orders of the defined types were placed in this encounter.   AUTHORIZATION INFORMATION Primary Insurance: 1D#: Group #:  Secondary Insurance: 1D#: Group #:  SCHEDULE INFORMATION: Date: 07/22/24 Time: Location: [facility]    [1]  Allergies Allergen Reactions   Penicillins      Since childhood, I don't know what happens.

## 2024-07-09 ENCOUNTER — Ambulatory Visit
Admission: RE | Admit: 2024-07-09 | Discharge: 2024-07-09 | Disposition: A | Discharge status: Home / Self Care | Source: Ambulatory Visit | Attending: Registered Nurse | Admitting: Registered Nurse

## 2024-07-09 DIAGNOSIS — Z1231 Encounter for screening mammogram for malignant neoplasm of breast: Secondary | ICD-10-CM | POA: Insufficient documentation

## 2024-07-14 ENCOUNTER — Other Ambulatory Visit: Payer: Self-pay | Admitting: Registered Nurse

## 2024-07-14 DIAGNOSIS — R928 Other abnormal and inconclusive findings on diagnostic imaging of breast: Secondary | ICD-10-CM

## 2024-07-22 ENCOUNTER — Encounter: Admission: RE | Payer: Self-pay | Source: Home / Self Care

## 2024-07-22 ENCOUNTER — Ambulatory Visit: Admission: RE | Admit: 2024-07-22 | Source: Home / Self Care | Admitting: Gastroenterology

## 2024-07-25 ENCOUNTER — Other Ambulatory Visit

## 8387-02-10 DEATH — deceased
